# Patient Record
Sex: Male | Born: 1970 | Race: Black or African American | Hispanic: No | Marital: Married | Smoking: Never smoker
Health system: Southern US, Community
[De-identification: ages and names within clinical notes are randomized; demographics above are authoritative.]

## PROBLEM LIST (undated history)

## (undated) DIAGNOSIS — I639 Cerebral infarction, unspecified: Secondary | ICD-10-CM

## (undated) DIAGNOSIS — R011 Cardiac murmur, unspecified: Secondary | ICD-10-CM

## (undated) DIAGNOSIS — F40240 Claustrophobia: Secondary | ICD-10-CM

## (undated) DIAGNOSIS — E785 Hyperlipidemia, unspecified: Secondary | ICD-10-CM

## (undated) DIAGNOSIS — I669 Occlusion and stenosis of unspecified cerebral artery: Secondary | ICD-10-CM

## (undated) DIAGNOSIS — I10 Essential (primary) hypertension: Secondary | ICD-10-CM

## (undated) DIAGNOSIS — D649 Anemia, unspecified: Secondary | ICD-10-CM

## (undated) HISTORY — DX: Hyperlipidemia, unspecified: E78.5

## (undated) HISTORY — DX: Cerebral infarction, unspecified: I63.9

## (undated) HISTORY — DX: Occlusion and stenosis of unspecified cerebral artery: I66.9

## (undated) HISTORY — DX: Essential (primary) hypertension: I10

---

## 2015-01-20 ENCOUNTER — Ambulatory Visit: Payer: Self-pay | Admitting: Family Medicine

## 2015-02-21 ENCOUNTER — Encounter: Payer: Self-pay | Admitting: Family Medicine

## 2015-02-21 ENCOUNTER — Ambulatory Visit (INDEPENDENT_AMBULATORY_CARE_PROVIDER_SITE_OTHER): Payer: BLUE CROSS/BLUE SHIELD | Admitting: Family Medicine

## 2015-02-21 VITALS — BP 140/78 | HR 86 | Temp 98.8°F | Resp 18 | Ht 74.0 in | Wt 214.7 lb

## 2015-02-21 DIAGNOSIS — I10 Essential (primary) hypertension: Secondary | ICD-10-CM

## 2015-02-21 DIAGNOSIS — E782 Mixed hyperlipidemia: Secondary | ICD-10-CM | POA: Insufficient documentation

## 2015-02-21 DIAGNOSIS — E785 Hyperlipidemia, unspecified: Secondary | ICD-10-CM

## 2015-02-21 DIAGNOSIS — R011 Cardiac murmur, unspecified: Secondary | ICD-10-CM | POA: Diagnosis not present

## 2015-02-21 NOTE — Progress Notes (Signed)
Name: Charles Alvarado   MRN: [id number]    DOB: Oct 30, 1970   Date:02/21/2015       Progress Note  Subjective  Chief Complaint  Chief Complaint  Patient presents with  . Establish Care    NP  . Hypertension    Hypertension This is a chronic problem. The problem is unchanged. The problem is controlled. Pertinent negatives include no chest pain, headaches, orthopnea, palpitations or shortness of breath. Past treatments include ACE inhibitors. There are no compliance problems.  There is no history of kidney disease, CAD/MI or CVA.  Hyperlipidemia This is a new problem. This is a new diagnosis. Condition status: 2 weeks ago, blood work at his job which came back showing elevated cholesterol. Pertinent negatives include no chest pain or shortness of breath.    Past Medical History  Diagnosis Date  . Hypertension   . Hyperlipidemia     History reviewed. No pertinent past surgical history.  Family History  Problem Relation Age of Onset  . Hypertension Mother   . Hypertension Father   . Diabetes Father   . Diabetes Sister   . Hypertension Sister     Social History   Social History  . Marital Status: Married    Spouse Name: N/A  . Number of Children: N/A  . Years of Education: N/A   Occupational History  . Not on file.   Social History Main Topics  . Smoking status: Never Smoker   . Smokeless tobacco: Never Used  . Alcohol Use: No  . Drug Use: No  . Sexual Activity:    Partners: Female   Other Topics Concern  . Not on file   Social History Narrative  . No narrative on file     Current outpatient prescriptions:  .  lisinopril (PRINIVIL,ZESTRIL) 20 MG tablet, Take 20 mg by mouth daily., Disp: , Rfl:   No Known Allergies   Review of Systems  Respiratory: Negative for shortness of breath.   Cardiovascular: Negative for chest pain, palpitations and orthopnea.  Neurological: Negative for headaches.     Objective  Filed Vitals:   02/21/15 1504  BP:  140/78  Pulse: 86  Temp: 98.8 F (37.1 C)  TempSrc: Oral  Resp: 18  Height: 6\' 2"  (1.88 m)  Weight: 214 lb 11.2 oz (97.387 kg)  SpO2: 97%    Physical Exam  Constitutional: He is oriented to person, place, and time and well-developed, well-nourished, and in no distress.  HENT:  Head: Normocephalic and atraumatic.  Eyes: Pupils are equal, round, and reactive to light.  Cardiovascular: Normal rate and regular rhythm.   Murmur heard. Pulmonary/Chest: Breath sounds normal.  Abdominal: Soft. Bowel sounds are normal.  Musculoskeletal: He exhibits no edema.  Neurological: He is alert and oriented to person, place, and time.  Skin: Skin is warm and dry.  Psychiatric: Mood, memory, affect and judgment normal.  Nursing note and vitals reviewed.  Assessment & Plan  1. Heart murmur  - Ambulatory referral to Cardiology  2. Essential hypertension We will request prior records for review. Patient to continue on lisinopril for now, advised to check BP at home and will review the lungs.  3. Hyperlipidemia Obtain copy of labwork performed at patient's job. Follow-up.  Hermilo Dutter Asad A. Faylene KurtzShah [facility] 02/21/2015 3:29 PM

## 2015-04-05 ENCOUNTER — Ambulatory Visit: Payer: BLUE CROSS/BLUE SHIELD | Admitting: Family Medicine

## 2015-04-12 ENCOUNTER — Ambulatory Visit: Payer: Self-pay | Admitting: Cardiovascular Disease

## 2015-04-13 ENCOUNTER — Ambulatory Visit (INDEPENDENT_AMBULATORY_CARE_PROVIDER_SITE_OTHER): Payer: BLUE CROSS/BLUE SHIELD | Admitting: Family Medicine

## 2015-04-13 ENCOUNTER — Encounter: Payer: Self-pay | Admitting: Family Medicine

## 2015-04-13 VITALS — BP 136/70 | HR 93 | Temp 98.8°F | Resp 17 | Ht 74.0 in | Wt 221.2 lb

## 2015-04-13 DIAGNOSIS — E785 Hyperlipidemia, unspecified: Secondary | ICD-10-CM

## 2015-04-13 DIAGNOSIS — I10 Essential (primary) hypertension: Secondary | ICD-10-CM | POA: Diagnosis not present

## 2015-04-13 NOTE — Progress Notes (Signed)
Name: Charles Alvarado   MRN: [id number]    DOB: March 12, 1970   Date:04/13/2015       Progress Note  Subjective  Chief Complaint  Chief Complaint  Patient presents with  . Follow-up    6 wk  . Hypertension  . Hyperlipidemia    Hypertension This is a chronic problem. The problem is controlled. Pertinent negatives include no chest pain, headaches, palpitations or shortness of breath. Past treatments include ACE inhibitors. There is no history of kidney disease, CAD/MI or CVA.  Hyperlipidemia Pertinent negatives include no chest pain or shortness of breath.     Past Medical History  Diagnosis Date  . Hypertension   . Hyperlipidemia     History reviewed. No pertinent past surgical history.  Family History  Problem Relation Age of Onset  . Hypertension Mother   . Hypertension Father   . Diabetes Father   . Diabetes Sister   . Hypertension Sister     Social History   Social History  . Marital Status: Married    Spouse Name: N/A  . Number of Children: N/A  . Years of Education: N/A   Occupational History  . Not on file.   Social History Main Topics  . Smoking status: Never Smoker   . Smokeless tobacco: Never Used  . Alcohol Use: No  . Drug Use: No  . Sexual Activity:    Partners: Female   Other Topics Concern  . Not on file   Social History Narrative     Current outpatient prescriptions:  .  lisinopril (PRINIVIL,ZESTRIL) 20 MG tablet, Take 20 mg by mouth daily., Disp: , Rfl:   No Known Allergies   Review of Systems  Respiratory: Negative for shortness of breath.   Cardiovascular: Negative for chest pain and palpitations.  Neurological: Negative for headaches.    Objective  Filed Vitals:   04/13/15 1549  BP: 136/70  Pulse: 93  Temp: 98.8 F (37.1 C)  TempSrc: Oral  Resp: 17  Height: 6\' 2"  (1.88 m)  Weight: 221 lb 3.2 oz (100.336 kg)  SpO2: 99%    Physical Exam  Constitutional: He is oriented to person, place, and time and  well-developed, well-nourished, and in no distress.  Cardiovascular: Normal rate and regular rhythm.   Pulmonary/Chest: Effort normal and breath sounds normal.  Neurological: He is alert and oriented to person, place, and time.  Nursing note and vitals reviewed.     Assessment & Plan  1. Essential hypertension BP stable and at goal.  2. Hyperlipidemia Obtain fasting lipid panel and follow-up. - Lipid Profile - Comprehensive Metabolic Panel (CMET)   Asusena Sigley Asad A. Faylene KurtzShah [facility] 04/13/2015 4:45 PM

## 2015-04-25 ENCOUNTER — Emergency Department: Payer: Worker's Compensation

## 2015-04-25 ENCOUNTER — Emergency Department
Admission: EM | Admit: 2015-04-25 | Discharge: 2015-04-25 | Disposition: A | Discharge status: Home / Self Care | Payer: Worker's Compensation | Attending: Emergency Medicine | Admitting: Emergency Medicine

## 2015-04-25 DIAGNOSIS — Y990 Civilian activity done for income or pay: Secondary | ICD-10-CM | POA: Insufficient documentation

## 2015-04-25 DIAGNOSIS — S0219XB Other fracture of base of skull, initial encounter for open fracture: Secondary | ICD-10-CM | POA: Diagnosis not present

## 2015-04-25 DIAGNOSIS — S0181XA Laceration without foreign body of other part of head, initial encounter: Secondary | ICD-10-CM

## 2015-04-25 DIAGNOSIS — S43004A Unspecified dislocation of right shoulder joint, initial encounter: Secondary | ICD-10-CM | POA: Diagnosis not present

## 2015-04-25 DIAGNOSIS — Y9389 Activity, other specified: Secondary | ICD-10-CM | POA: Diagnosis not present

## 2015-04-25 DIAGNOSIS — W010XXA Fall on same level from slipping, tripping and stumbling without subsequent striking against object, initial encounter: Secondary | ICD-10-CM | POA: Insufficient documentation

## 2015-04-25 DIAGNOSIS — Y929 Unspecified place or not applicable: Secondary | ICD-10-CM | POA: Diagnosis not present

## 2015-04-25 DIAGNOSIS — Z79899 Other long term (current) drug therapy: Secondary | ICD-10-CM | POA: Diagnosis not present

## 2015-04-25 DIAGNOSIS — E785 Hyperlipidemia, unspecified: Secondary | ICD-10-CM | POA: Diagnosis not present

## 2015-04-25 DIAGNOSIS — I10 Essential (primary) hypertension: Secondary | ICD-10-CM | POA: Diagnosis not present

## 2015-04-25 MED ORDER — TETANUS-DIPHTH-ACELL PERTUSSIS 5-2.5-18.5 LF-MCG/0.5 IM SUSP
0.5000 mL | Freq: Once | INTRAMUSCULAR | Status: AC
Start: 2015-04-25 — End: 2015-04-25
  Administered 2015-04-25: 0.5 mL via INTRAMUSCULAR

## 2015-04-25 MED ORDER — PROPOFOL 1000 MG/100ML IV EMUL
INTRAVENOUS | Status: AC
  Filled 2015-04-25: qty 100

## 2015-04-25 MED ORDER — AMOXICILLIN-POT CLAVULANATE 875-125 MG PO TABS: 1.0000 | ORAL_TABLET | Freq: Two times a day (BID) | ORAL | Status: AC

## 2015-04-25 MED ORDER — PROPOFOL 10 MG/ML IV BOLUS
INTRAVENOUS | Status: AC | PRN
  Administered 2015-04-25: 80 mg via INTRAVENOUS

## 2015-04-25 MED ORDER — TETANUS-DIPHTHERIA TOXOIDS TD 5-2 LFU IM INJ: 0.5000 mL | INJECTION | Freq: Once | INTRAMUSCULAR | Status: DC

## 2015-04-25 MED ORDER — LIDOCAINE-EPINEPHRINE (PF) 1 %-1:200000 IJ SOLN
INTRAMUSCULAR | Status: AC
  Administered 2015-04-25: 30 mL
  Filled 2015-04-25: qty 30

## 2015-04-25 MED ORDER — LIDOCAINE-EPINEPHRINE (PF) 2 %-1:200000 IJ SOLN
20.0000 mL | Freq: Once | INTRAMUSCULAR | Status: DC
  Filled 2015-04-25: qty 20

## 2015-04-25 MED ORDER — OXYCODONE-ACETAMINOPHEN 5-325 MG PO TABS
1.0000 | ORAL_TABLET | Freq: Once | ORAL | Status: AC
  Administered 2015-04-25: 1 via ORAL
  Filled 2015-04-25: qty 1

## 2015-04-25 MED ORDER — HYDROCODONE-ACETAMINOPHEN 5-325 MG PO TABS: 1.0000 | ORAL_TABLET | ORAL | Status: DC | PRN

## 2015-04-25 NOTE — ED Notes (Addendum)
Pt consent forms and moderate sedation d.c instructions signed at this time and placed on pt's chart. Pt given copy of d.c instructions at this time, family at bedside, vitals stable, NAD noted. Per MD Kinner, sedation will take place momentarily. Pt and family made aware and verbalized understanding at this time.

## 2015-04-25 NOTE — ED Notes (Signed)
Patient transported to CT 

## 2015-04-25 NOTE — ED Provider Notes (Signed)
Presedation evaluation performed at 1:30 PM, post-sedation evaluation performed at 2:20 PM  Jene Everyobert Latesha Chesney, MD 04/25/15 1659

## 2015-04-25 NOTE — ED Notes (Signed)
UDS done @ 1253pm and walked to lab @ 1257

## 2015-04-25 NOTE — ED Provider Notes (Signed)
[facility] Emergency Department Provider Note  ____________________________________________    I have reviewed the triage vital signs and the nursing notes.   HISTORY  Chief Complaint Laceration    HPI Charles Alvarado is a 45 y.o. male who presents after a fall. Patient reports he tripped and fell and hit his forehead at work and also injured his right shoulder. He suffered a laceration to the forehead and to the chin. He denies chest pain or palpitations. He denies other injuries. No dizziness.     Past Medical History  Diagnosis Date  . Hypertension   . Hyperlipidemia     Patient Active Problem List   Diagnosis Date Noted  . Heart murmur 02/21/2015  . Hypertension 02/21/2015  . Hyperlipidemia 02/21/2015    No past surgical history on file.  Current Outpatient Rx  Name  Route  Sig  Dispense  Refill  . lisinopril (PRINIVIL,ZESTRIL) 20 MG tablet   Oral   Take 20 mg by mouth daily.           Allergies Review of patient's allergies indicates no known allergies.  Family History  Problem Relation Age of Onset  . Hypertension Mother   . Hypertension Father   . Diabetes Father   . Diabetes Sister   . Hypertension Sister     Social History Social History  Substance Use Topics  . Smoking status: Never Smoker   . Smokeless tobacco: Never Used  . Alcohol Use: No    Review of Systems  Constitutional: Negative for Dizziness Eyes: Negative for redness ENT: Negative for Neck pain Cardiovascular: Negative for chest pain Respiratory: Negative for shortness of breath. Gastrointestinal: Negative for abdominal pain  Musculoskeletal: Negative for back pain. Right shoulder pain Skin: Positive for laceration Neurological: Negative for focal weakness, no sensory deficits Psychiatric: Positive for anxiety    ____________________________________________   PHYSICAL EXAM:  VITAL SIGNS: ED Triage Vitals  Enc Vitals Group     BP  04/25/15 1123 173/90 mmHg     Pulse Rate 04/25/15 1123 94     Resp 04/25/15 1123 20     Temp --      Temp src --      SpO2 04/25/15 1123 98 %     Weight 04/25/15 1123 220 lb (99.791 kg)     Height 04/25/15 1123  (1.88 m)     Head Cir --      Peak Flow --      Pain Score 04/25/15 1124 10     Pain Loc --      Pain Edu? --      Excl. in GC? --      Constitutional: Alert and oriented. Well appearing and in no distress.  Eyes: Conjunctivae are normal. No erythema or injection ENT   Head: Normocephalic, right forehead 3.5 cm laceration, 3.5 cm laceration to the right chin which appears to be through and through.   Mouth/Throat: Mucous membranes are moist. Teeth align appropriately Cardiovascular: Normal rate, regular rhythm. Normal and symmetric distal pulses are present in the upper extremities.  Respiratory: Normal respiratory effort without tachypnea nor retractions. Breath sounds are clear and equal bilaterally.  Gastrointestinal: Soft and non-tender in all quadrants. No distention. There is no CVA tenderness. Genitourinary: deferred Musculoskeletal: Right shoulder with anterior fullness. 2+ distal pulses. No apparent sensory deficits. Neurologic:  Normal speech and language. No gross focal neurologic deficits are appreciated. Skin:  Skin is warm, dry and intact. No rash noted.  Psychiatric: Mood and affect are normal. Patient exhibits appropriate insight and judgment.  ____________________________________________    LABS (pertinent positives/negatives)  Labs Reviewed - No data to display  ____________________________________________   EKG  ED ECG REPORT I, Jene EveryKINNER, Darlina Mccaughey, the attending physician, personally viewed and interpreted this ECG.  Date: 04/25/2015 EKG Time: 11:44 AM Rate: 80 Rhythm: normal sinus rhythm QRS Axis: normal Intervals: normal ST/T Wave abnormalities: normal Conduction Disturbances: none Narrative Interpretation: Single  PVC   ____________________________________________    RADIOLOGY  Right anterior shoulder dislocation CT head unremarkable ____________________________________________   PROCEDURES  Procedure(s) performed: yes, multiple  1. Shoulder reduction  Reduction of dislocation Date/Time: 2:05 PM Performed by: Jene EveryKINNER, Dain Laseter Authorized by: Jene EveryKINNER, Clela Hagadorn Consent: Verbal consent obtained. Risks and benefits: risks, benefits and alternatives were discussed Consent given by: patient Required items: required blood products, implants, devices, and special equipment available Time out: Immediately prior to procedure a "time out" was called to verify the correct patient, procedure, equipment, support staff and site/side marked as required.  Patient sedated:conscious sedation/propofol  Vitals: Vital signs were monitored during sedation. Patient tolerance: Patient tolerated the procedure well with no immediate complications. Joint: Shoulder Reduction technique: Traction countertraction     Procedural sedation Performed by: Jene EveryKINNER, Stephie Xu Consent: Verbal consent obtained. Risks and benefits: risks, benefits and alternatives were discussed Required items: required blood products, implants, devices, and special equipment available Patient identity confirmed: arm band and provided demographic data Time out: Immediately prior to procedure a "time out" was called to verify the correct patient, procedure, equipment, support staff and site/side marked as required.  Sedation type: moderate (conscious) sedation NPO time confirmed and considedered  Sedatives: Propofol  Physician Time at Bedside: 15 minutes  Vitals: Vital signs were monitored during sedation. Cardiac Monitor, pulse oximeter Patient tolerance: Patient tolerated the procedure well with no immediate complications. Comments: Pt with uneventful recovered. Returned to pre-procedural sedation baseline      2. Laceration  repair LACERATION REPAIR Performed by: Jene EveryKINNER, Haeli Gerlich Authorized by: Jene EveryKINNER, Zedekiah Hinderman Consent: Verbal consent obtained. Risks and benefits: risks, benefits and alternatives were discussed Consent given by: patient Patient identity confirmed: provided demographic data Prepped and Draped in normal sterile fashion Wound explored  Laceration Location: right forehead  Laceration Length: 3.5cm  No Foreign Bodies seen or palpated  Anesthesia: local infiltration  Local anesthetic: lidocaine 1 % with epinephrine  Anesthetic total: 3 ml  Irrigation method: syringe Amount of cleaning: standard  Skin closure: Layered closure, 2 Vicryl   Number of sutures: 4 Prolene   Technique: Simple interrupted   Patient tolerance: Patient tolerated the procedure well with no immediate complications.      3. Laceration repair  LACERATION REPAIR Performed by: Jene EveryKINNER, Dorris Vangorder Authorized by: Jene EveryKINNER, Peytin Dechert Consent: Verbal consent obtained. Risks and benefits: risks, benefits and alternatives were discussed Consent given by: patient Patient identity confirmed: provided demographic data Prepped and Draped in normal sterile fashion Wound explored  Laceration Location: Chin  Laceration Length: A 3.5cm  No Foreign Bodies seen or palpated  Anesthesia: local infiltration  Local anesthetic: lidocaine 1 % with epinephrine  Anesthetic total: 3 ml  Irrigation method: syringe Amount of cleaning: standard  Skin closure: Prolene   Number of sutures: 4   Technique: Simple interrupted   Patient tolerance: Patient tolerated the procedure well with no immediate complications.   Critical Care performed: none  ____________________________________________   INITIAL IMPRESSION / ASSESSMENT AND PLAN / ED COURSE  Pertinent labs & imaging results that were available during my  care of the patient were reviewed by me and considered in my medical decision making (see chart for  details).  Patient with mechanical fall, anterior right shoulder dislocation, 2 facial lacerations. He will require conscious sedation to reduce the shoulder  Right shoulder successfully reduced with propofol conscious sedation. Lacerations repaired. Tetanus given.  Discussed forehead laceration with ENT surgeon who recommends layered closure and antibiotics and he will see the patient in his office  ____________________________________________   FINAL CLINICAL IMPRESSION(S) / ED DIAGNOSES  Final diagnoses:  Forehead laceration, initial encounter  Chin laceration, initial encounter  Open fracture of frontal sinus, initial encounter [facility])  Shoulder dislocation, right, initial encounter          Jene Every, MD 04/25/15 1656

## 2015-04-25 NOTE — ED Notes (Signed)
MD Kinner at bedside suturing pt's lacerations at this time.

## 2015-04-25 NOTE — ED Notes (Signed)
MD Kinner at bedside  

## 2015-04-25 NOTE — ED Notes (Addendum)
Pharmacy called regarding Boostrix, will send to ED momentarily. Pt will be d/c once med is received and given. Pt and family verbalized understanding.

## 2015-04-25 NOTE — Sedation Documentation (Signed)
Xray at bedside at this time, shoulder reduced successfully.

## 2015-04-25 NOTE — ED Notes (Addendum)
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  Possible loc at time of fall. Puncture wound noted to forehead and laceration noted to chin, no missing teeth noted in triage.  Pt also c/o right shoulder pain. Pt diaphoretic in triage.

## 2015-04-25 NOTE — Discharge Instructions (Signed)
Facial Laceration °A facial laceration is a cut on the face. These injuries can be painful and cause bleeding. Some cuts may need to be closed with stitches (sutures), skin adhesive strips, or wound glue. Cuts usually heal quickly but can leave a scar. It can take 1-2 years for the scar to go away completely. °HOME CARE  °· Only take medicines as told by your doctor. °· Follow your doctor's instructions for wound care. °For Stitches: °· Keep the cut clean and dry. °· If you have a bandage (dressing), change it at least once a day. Change the bandage if it gets wet or dirty, or as told by your doctor. °· Wash the cut with soap and water 2 times a day. Rinse the cut with water. Pat it dry with a clean towel. °· Put a thin layer of medicated cream on the cut as told by your doctor. °· You may shower after the first 24 hours. Do not soak the cut in water until the stitches are removed. °· Have your stitches removed as told by your doctor. °· Do not wear any makeup until a few days after your stitches are removed. °For Skin Adhesive Strips: °· Keep the cut clean and dry. °· Do not get the strips wet. You may take a bath, but be careful to keep the cut dry. °· If the cut gets wet, pat it dry with a clean towel. °· The strips will fall off on their own. Do not remove the strips that are still stuck to the cut. °For Wound Glue: °· You may shower or take baths. Do not soak or scrub the cut. Do not swim. Avoid heavy sweating until the glue falls off on its own. After a shower or bath, pat the cut dry with a clean towel. °· Do not put medicine or makeup on your cut until the glue falls off. °· If you have a bandage, do not put tape over the glue. °· Avoid lots of sunlight or tanning lamps until the glue falls off. °· The glue will fall off on its own in 5-10 days. Do not pick at the glue. °After Healing: °· Put sunscreen on the cut for the first year to reduce your scar. °GET HELP IF: °· You have a fever. °GET HELP RIGHT AWAY  IF:  °· Your cut area gets red, painful, or puffy (swollen). °· You see a yellowish-white fluid (pus) coming from the cut. °  °This information is not intended to replace advice given to you by your health care provider. Make sure you discuss any questions you have with your health care provider. °  °Document Released: 07/11/2007 Document Revised: 02/12/2014 Document Reviewed: 09/04/2012 °Elsevier Interactive Patient Education ©2016 [employer] Inc[employer] ° °Head Injury, Adult °You have a head injury. Headaches and throwing up (vomiting) are common after a head injury. It should be easy to wake up from sleeping. Sometimes you must stay in the hospital. Most problems happen within the first 24 hours. Side effects may occur up to 7-10 days after the injury.  °WHAT ARE THE TYPES OF HEAD INJURIES? °Head injuries can be as minor as a bump. Some head injuries can be more severe. More severe head injuries include: °· A jarring injury to the brain (concussion). °· A bruise of the brain (contusion). This mean there is bleeding in the brain that can cause swelling. °· A cracked skull (skull fracture). °· Bleeding in the brain that collects, clots, and forms a bump (hematoma). °WHEN   SHOULD I GET HELP RIGHT AWAY?   You are confused or sleepy.  You cannot be woken up.  You feel sick to your stomach (nauseous) or keep throwing up (vomiting).  Your dizziness or unsteadiness is getting worse.  You have very bad, lasting headaches that are not helped by medicine. Take medicines only as told by your doctor.  You cannot use your arms or legs like normal.  You cannot walk.  You notice changes in the black spots in the center of the colored part of your eye (pupil).  You have clear or bloody fluid coming from your nose or ears.  You have trouble seeing. During the next 24 hours after the injury, you must stay with someone who can watch you. This person should get help right away (call 911 in the [place] if you start to shake  and are not able to control it (have seizures), you pass out, or you are unable to wake up. HOW CAN I PREVENT A HEAD INJURY IN THE FUTURE?  Wear seat belts.  Wear a helmet while bike riding and playing sports like football.  Stay away from dangerous activities around the house. WHEN CAN I RETURN TO NORMAL ACTIVITIES AND ATHLETICS? See your doctor before doing these activities. You should not do normal activities or play contact sports until 1 week after the following symptoms have stopped:  Headache that does not go away.  Dizziness.  Poor attention.  Confusion.  Memory problems.  Sickness to your stomach or throwing up.  Tiredness.  Fussiness.  Bothered by bright lights or loud noises.  Anxiousness or depression.  Restless sleep. MAKE SURE YOU:   Understand these instructions.  Will watch your condition.  Will get help right away if you are not doing well or get worse.   This information is not intended to replace advice given to you by your health care provider. Make sure you discuss any questions you have with your health care provider.   Document Released: 01/05/2008 Document Revised: 02/12/2014 Document Reviewed: 09/29/2012 Elsevier Interactive Patient Education 2016 [employer].  How to Use a Shoulder Immobilizer A shoulder immobilizer is a device that you may have to wear after a shoulder injury or surgery. This device keeps your arm from moving. This prevents additional pain or injury. It also supports your arm next to your body as your shoulder heals. You may need to wear a shoulder immobilizer to treat a broken bone (fracture) in your shoulder. You may also need to wear one if you have an injury that moves your shoulder out of position (dislocation). There are different types of shoulder immobilizers. The one that you get depends on your injury. RISKS AND COMPLICATIONS Wearing a shoulder immobilizer in the wrong way can let your injured shoulder move  around too much. This may delay healing and make your pain and swelling worse. HOW TO USE YOUR SHOULDER IMMOBILIZER  The part of the immobilizer that goes around your neck (sling) should support your upper arm, with your elbow bent and your lower arm and hand across your chest.  Make sure that your elbow:  Is snug against the back pocket of the sling.  Does not move away from your body.  The strap of the immobilizer should go over your shoulder and support your arm and hand. Your hand should be slightly higher than your elbow. It should not hang loosely over the edge of the sling.  If the long strap has a pad, place it  where it is most comfortable on your neck.  Carefully follow your health care provider's instructions for wearing your shoulder immobilizer. Your health care provider may want you to:  Loosen your immobilizer to straighten your elbow and move your wrist and fingers. You may have to do this several times each day. Ask your health care provider when you should do this and how often.  Remove your immobilizer once every day to shower, but limit the movement in your injured arm. Before putting the immobilizer back on, use a towel to dry the area under your arm completely.  Remove your immobilizer to do shoulder exercises at home as directed by your health care provider.  Wear your immobilizer while you sleep. You may sleep more comfortably if you have your upper body raised on pillows. SEEK MEDICAL CARE IF:  Your immobilizer is not supporting your arm properly.  Your immobilizer gets damaged.  You have worsening pain or swelling in your shoulder, arm, or hand.  Your shoulder, arm, or hand changes color or temperature.  You lose feeling in your shoulder, arm, or hand.   This information is not intended to replace advice given to you by your health care provider. Make sure you discuss any questions you have with your health care provider.   Document Released: 03/01/2004  Document Revised: 06/08/2014 Document Reviewed: 12/30/2013 Elsevier Interactive Patient Education 2016 Elsevier Inc.  Shoulder Dislocation A shoulder dislocation happens when the upper arm bone (humerus) moves out of the shoulder joint. The shoulder joint is the part of the shoulder where the humerus, shoulder blade (scapula), and collarbone (clavicle) meet. CAUSES This condition is often caused by:  A fall.  A hit to the shoulder.  A forceful movement of the shoulder. RISK FACTORS This condition is more likely to develop in people who play sports. SYMPTOMS Symptoms of this condition include:  Deformity of the shoulder.  Intense pain.  Inability to move the shoulder.  Numbness, weakness, or tingling in your neck or down your arm.  Bruising or swelling around your shoulder. DIAGNOSIS This condition is diagnosed with a physical exam. After the exam, tests may be done to check for related problems. Tests that may be done include:  X-ray. This may be done to check for broken bones.  MRI. This may be done to check for damage to the tissues around the shoulder.  Electromyogram. This may be done to check for nerve damage. TREATMENT This condition is treated with a procedure to place the humerus back in the joint. This procedure is called a reduction. There are two types of reduction:  Closed reduction. In this procedure, the humerus is placed back in the joint without surgery. The health care provider uses his or her hands to guide the bone back into place.  Open reduction. In this procedure, the humerus is placed back in the joint with surgery. An open reduction may be recommended if:  You have a weak shoulder joint or weak ligaments.  You have had more than one shoulder dislocation.  The nerves or blood vessels around your shoulder have been damaged. After the humerus is placed back into the joint, your arm will be placed in a splint or sling to prevent it from moving. You  will need to wear the splint or sling until your shoulder heals. When the splint or sling is removed, you may have physical therapy to help improve the range of motion in your shoulder joint. HOME CARE INSTRUCTIONS If You Have a  Splint or Sling:  Wear it as told by your health care provider. Remove it only as told by your health care provider.  Loosen it if your fingers become numb and tingle, or if they turn cold and blue.  Keep it clean and dry. Bathing  Do not take baths, swim, or use a hot tub until your health care provider approves. Ask your health care provider if you can take showers. You may only be allowed to take sponge baths for bathing.  If your health care provider approves bathing and showering, cover your splint or sling with a watertight plastic bag to protect it from water. Do not let the splint or sling get wet. Managing Pain, Stiffness, and Swelling  If directed, apply ice to the injured area.  Put ice in a plastic bag.  Place a towel between your skin and the bag.  Leave the ice on for 20 minutes, 2-3 times per day.  Move your fingers often to avoid stiffness and to decrease swelling.  Raise (elevate) the injured area above the level of your heart while you are sitting or lying down. Driving  Do not drive while wearing a splint or sling on a hand that you use for driving.  Do not drive or operate heavy machinery while taking pain medicine. Activity  Return to your normal activities as told by your health care provider. Ask your health care provider what activities are safe for you.  Perform range-of-motion exercises only as told by your health care provider.  Exercise your hand by squeezing a soft ball. This helps to decrease stiffness and swelling in your hand and wrist. General Instructions  Take over-the-counter and prescription medicines only as told by your health care provider.  Do not use any tobacco products, including cigarettes, chewing  tobacco, or e-cigarettes. Tobacco can delay bone and tissue healing. If you need help quitting, ask your health care provider.  Keep all follow-up visits as told by your health care provider. This is important. SEEK MEDICAL CARE IF:  Your splint or sling gets damaged. SEEK IMMEDIATE MEDICAL CARE IF:  Your pain gets worse rather than better.  You lose feeling in your arm or hand.  Your arm or hand becomes white and cold.   This information is not intended to replace advice given to you by your health care provider. Make sure you discuss any questions you have with your health care provider.   Document Released: 10/17/2000 Document Revised: 10/13/2014 Document Reviewed: 05/17/2014 Elsevier Interactive Patient Education [employer]2016 Elsevier Inc.

## 2015-05-24 ENCOUNTER — Ambulatory Visit: Payer: Self-pay | Admitting: Cardiovascular Disease

## 2015-05-30 ENCOUNTER — Other Ambulatory Visit: Payer: Self-pay | Admitting: Orthopedic Surgery

## 2015-06-02 ENCOUNTER — Ambulatory Visit: Payer: Self-pay | Admitting: Cardiovascular Disease

## 2015-06-06 HISTORY — PX: CLOSED REDUCTION SHOULDER DISLOCATION: SUR242

## 2015-06-14 ENCOUNTER — Other Ambulatory Visit: Payer: Self-pay

## 2015-06-14 ENCOUNTER — Encounter: Payer: Self-pay | Admitting: *Deleted

## 2015-06-14 NOTE — Patient Instructions (Signed)
  Your procedure is scheduled on: 06-16-15 (THURSDAY) Report to [facility] 2ND FLOOR To find out your arrival time please call [phone] between 1PM - 3PM on 06-15-15 [facility](WEDNESDAY)  Remember: Instructions that are not followed completely may result in serious medical risk, up to and including death, or upon the discretion of your surgeon and anesthesiologist your surgery may need to be rescheduled.    _X___ 1. Do not eat food or drink liquids after midnight. No gum chewing or hard candies.     _X___ 2. No Alcohol for 24 hours before or after surgery.   ____ 3. Bring all medications with you on the day of surgery if instructed.    _X___ 4. Notify your doctor if there is any change in your medical condition     (cold, fever, infections).     Do not wear jewelry, make-up, hairpins, clips or nail polish.  Do not wear lotions, powders, or perfumes. You may wear deodorant.  Do not shave 48 hours prior to surgery. Men may shave face and neck.  Do not bring valuables to the hospital.    [facility] belongings or valuables.               Contacts, dentures or bridgework may not be worn into surgery.  Leave your suitcase in the car. After surgery it may be brought to your room.  For patients admitted to the hospital, discharge time is determined by your treatment team.   Patients discharged the day of surgery will not be allowed to drive home.   Please read over the following fact sheets that you were given:     _X___ Take these medicines the morning of surgery with A SIP OF WATER:    1. LISINOPRIL  2.   3.   4.  5.  6.  ____ Fleet Enema (as directed)   _X___ Use CHG Soap as directed  ____ Use inhalers on the day of surgery  ____ Stop metformin 2 days prior to surgery    ____ Take 1/2 of usual insulin dose the night before surgery and none on the morning of surgery.   ____ Stop Coumadin/Plavix/aspirin-N/A  _X___ Stop  Anti-inflammatories-STOP IBUPROFEN NOW-NO NSAIDS OR ASPIRIN PRODUCTS-TYLENOL OK TO TAKE   ____ Stop supplements until after surgery.    ____ Bring C-Pap to the hospital.

## 2015-06-15 ENCOUNTER — Encounter
Admission: RE | Admit: 2015-06-15 | Discharge: 2015-06-15 | Disposition: A | Discharge status: Home / Self Care | Payer: Worker's Compensation | Source: Ambulatory Visit | Attending: Orthopedic Surgery | Admitting: Orthopedic Surgery

## 2015-06-15 DIAGNOSIS — Z833 Family history of diabetes mellitus: Secondary | ICD-10-CM | POA: Diagnosis not present

## 2015-06-15 DIAGNOSIS — Z79899 Other long term (current) drug therapy: Secondary | ICD-10-CM | POA: Diagnosis not present

## 2015-06-15 DIAGNOSIS — I10 Essential (primary) hypertension: Secondary | ICD-10-CM | POA: Diagnosis not present

## 2015-06-15 DIAGNOSIS — M24411 Recurrent dislocation, right shoulder: Secondary | ICD-10-CM | POA: Diagnosis present

## 2015-06-15 DIAGNOSIS — D649 Anemia, unspecified: Secondary | ICD-10-CM | POA: Diagnosis not present

## 2015-06-15 LAB — CBC WITH DIFFERENTIAL/PLATELET
Basophils Absolute: 0.1 10*3/uL (ref 0–0.1)
Basophils Relative: 2 %
EOS ABS: 0.3 10*3/uL (ref 0–0.7)
EOS PCT: 6 %
HCT: 41 % (ref 40.0–52.0)
Hemoglobin: 14 g/dL (ref 13.0–18.0)
LYMPHS ABS: 1.7 10*3/uL (ref 1.0–3.6)
LYMPHS PCT: 37 %
MCH: 27 pg (ref 26.0–34.0)
MCHC: 34.2 g/dL (ref 32.0–36.0)
MCV: 79.1 fL — ABNORMAL LOW (ref 80.0–100.0)
MONO ABS: 0.3 10*3/uL (ref 0.2–1.0)
MONOS PCT: 7 %
Neutro Abs: 2.2 10*3/uL (ref 1.4–6.5)
Neutrophils Relative %: 48 %
PLATELETS: 213 10*3/uL (ref 150–440)
RBC: 5.18 MIL/uL (ref 4.40–5.90)
RDW: 13.2 % (ref 11.5–14.5)
WBC: 4.5 10*3/uL (ref 3.8–10.6)

## 2015-06-15 LAB — BASIC METABOLIC PANEL
Anion gap: 4 — ABNORMAL LOW (ref 5–15)
BUN: 13 mg/dL (ref 6–20)
CHLORIDE: 106 mmol/L (ref 101–111)
CO2: 29 mmol/L (ref 22–32)
CREATININE: 1.1 mg/dL (ref 0.61–1.24)
Calcium: 10.3 mg/dL (ref 8.9–10.3)
GFR calc Af Amer: >60 mL/min (ref >60)
GFR calc non Af Amer: >60 mL/min (ref >60)
GLUCOSE: 98 mg/dL (ref 65–99)
Potassium: 4 mmol/L (ref 3.5–5.1)
SODIUM: 139 mmol/L (ref 135–145)

## 2015-06-15 LAB — PROTIME-INR
INR: 0.95
PROTHROMBIN TIME: 12.9 s (ref 11.4–15.0)

## 2015-06-15 LAB — APTT: aPTT: 29 seconds (ref 24–36)

## 2015-06-15 NOTE — Pre-Procedure Instructions (Signed)
Progress Notes Info    Chartered loss adjusterAuthor Note Status Last Update User Last Update Date/Time   Ellyn HackSyed Asad A Shah, MD Signed Ellyn HackSyed Asad A Shah, MD 02/22/2015 9:34 PM    Progress Notes    Expand All Collapse All   Name: Charles SoChristopher M Ayala MRN: [id number] DOB: [birth year] Date:02/21/2015  Progress Note  Subjective  Chief Complaint  Chief Complaint  Patient presents with  . Establish Care    NP  . Hypertension    Hypertension This is a chronic problem. The problem is unchanged. The problem is controlled. Pertinent negatives include no chest pain, headaches, orthopnea, palpitations or shortness of breath. Past treatments include ACE inhibitors. There are no compliance problems. There is no history of kidney disease, CAD/MI or CVA.  Hyperlipidemia This is a new problem. This is a new diagnosis. Condition status: 2 weeks ago, blood work at his job which came back showing elevated cholesterol. Pertinent negatives include no chest pain or shortness of breath.    Past Medical History  Diagnosis Date  . Hypertension   . Hyperlipidemia     History reviewed. No pertinent past surgical history.  Family History  Problem Relation Age of Onset  . Hypertension Mother   . Hypertension Father   . Diabetes Father   . Diabetes Sister   . Hypertension Sister     Social History   Social History  . Marital Status: Married    Spouse Name: N/A  . Number of Children: N/A  . Years of Education: N/A   Occupational History  . Not on file.   Social History Main Topics  . Smoking status: Never Smoker   . Smokeless tobacco: Never Used  . Alcohol Use: No  . Drug Use: No  . Sexual Activity:    Partners: Female   Other Topics Concern  . Not on file   Social History Narrative  . No narrative on file     Current outpatient  prescriptions:  . lisinopril (PRINIVIL,ZESTRIL) 20 MG tablet, Take 20 mg by mouth daily., Disp: , Rfl:   No Known Allergies   Review of Systems  Respiratory: Negative for shortness of breath.  Cardiovascular: Negative for chest pain, palpitations and orthopnea.  Neurological: Negative for headaches.     Objective  Filed Vitals:   02/21/15 1504  BP: 140/78  Pulse: 86  Temp: 98.8 F (37.1 C)  TempSrc: Oral  Resp: 18  Height: 6\' 2"  (1.88 m)  Weight: 214 lb 11.2 oz (97.387 kg)  SpO2: 97%    Physical Exam  Constitutional: He is oriented to person, place, and time and well-developed, well-nourished, and in no distress.  HENT:  Head: Normocephalic and atraumatic.  Eyes: Pupils are equal, round, and reactive to light.  Cardiovascular: Normal rate and regular rhythm.  Murmur heard. Pulmonary/Chest: Breath sounds normal.  Abdominal: Soft. Bowel sounds are normal.  Musculoskeletal: He exhibits no edema.  Neurological: He is alert and oriented to person, place, and time.  Skin: Skin is warm and dry.  Psychiatric: Mood, memory, affect and judgment normal.  Nursing note and vitals reviewed.  Assessment & Plan  1. Heart murmur  - Ambulatory referral to Cardiology  2. Essential hypertension We will request prior records for review. Patient to continue on lisinopril for now, advised to check BP at home and will review the lungs.  3. Hyperlipidemia Obtain copy of labwork performed at patient's job. Follow-up.  Syed Asad A. Faylene KurtzShah [facility] 02/21/2015 3:29 PM

## 2015-06-15 NOTE — Pre-Procedure Instructions (Signed)
Patient Name Sex DOB SSN   Charles Alvarado, Charles Alvarado Male 27-Oct-1970 [id number]    ED Provider Notes by Jene Every, MD at 04/25/2015 12:24 PM    Author: Jene Every, MD Service: (none) Author Type: Physician   Filed: 04/25/2015 4:56 PM Note Time: 04/25/2015 12:24 PM Status: Signed   Editor: Jene Every, MD (Physician)     Expand All Collapse All   [facility] Emergency Department Provider Note  ____________________________________________    I have reviewed the triage vital signs and the nursing notes.   HISTORY  Chief Complaint Laceration    HPI Charles Alvarado is a 45 y.o. male who presents after a fall. Patient reports he tripped and fell and hit his forehead at work and also injured his right shoulder. He suffered a laceration to the forehead and to the chin. He denies chest pain or palpitations. He denies other injuries. No dizziness.     Past Medical History  Diagnosis Date  . Hypertension   . Hyperlipidemia     Patient Active Problem List   Diagnosis Date Noted  . Heart murmur 02/21/2015  . Hypertension 02/21/2015  . Hyperlipidemia 02/21/2015    No past surgical history on file.  Current Outpatient Rx  Name  Route  Sig  Dispense  Refill  . lisinopril (PRINIVIL,ZESTRIL) 20 MG tablet   Oral   Take 20 mg by mouth daily.           Allergies Review of patient's allergies indicates no known allergies.  Family History  Problem Relation Age of Onset  . Hypertension Mother   . Hypertension Father   . Diabetes Father   . Diabetes Sister   . Hypertension Sister     Social History Social History  Substance Use Topics  . Smoking status: Never Smoker   . Smokeless tobacco: Never Used  . Alcohol Use: No    Review of Systems  Constitutional: Negative for Dizziness Eyes: Negative for redness ENT: Negative for Neck  pain Cardiovascular: Negative for chest pain Respiratory: Negative for shortness of breath. Gastrointestinal: Negative for abdominal pain  Musculoskeletal: Negative for back pain. Right shoulder pain Skin: Positive for laceration Neurological: Negative for focal weakness, no sensory deficits Psychiatric: Positive for anxiety    ____________________________________________   PHYSICAL EXAM:  VITAL SIGNS: ED Triage Vitals  Enc Vitals Group   BP 04/25/15 1123 173/90 mmHg   Pulse Rate 04/25/15 1123 94   Resp 04/25/15 1123 20   Temp --    Temp src --    SpO2 04/25/15 1123 98 %   Weight 04/25/15 1123 220 lb (99.791 kg)   Height 04/25/15 1123  (1.88 m)   Head Cir --    Peak Flow --    Pain Score 04/25/15 1124 10   Pain Loc --    Pain Edu? --    Excl. in GC? --      Constitutional: Alert and oriented. Well appearing and in no distress.  Eyes: Conjunctivae are normal. No erythema or injection ENT   Head: Normocephalic, right forehead 3.5 cm laceration, 3.5 cm laceration to the right chin which appears to be through and through.   Mouth/Throat: Mucous membranes are moist. Teeth align appropriately Cardiovascular: Normal rate, regular rhythm. Normal and symmetric distal pulses are present in the upper extremities.  Respiratory: Normal respiratory effort without tachypnea nor retractions. Breath sounds are clear and equal bilaterally.  Gastrointestinal: Soft and non-tender in all quadrants. No distention. There is no  CVA tenderness. Genitourinary: deferred Musculoskeletal: Right shoulder with anterior fullness. 2+ distal pulses. No apparent sensory deficits. Neurologic: Normal speech and language. No gross focal neurologic deficits are appreciated. Skin: Skin is warm, dry and intact. No rash noted. Psychiatric: Mood and affect are normal. Patient exhibits appropriate insight and  judgment.  ____________________________________________   LABS (pertinent positives/negatives)  Labs Reviewed - No data to display  ____________________________________________   EKG  ED ECG REPORT I, Jene EveryKINNER, ROBERT, the attending physician, personally viewed and interpreted this ECG.  Date: 04/25/2015 EKG Time: 11:44 AM Rate: 80 Rhythm: normal sinus rhythm QRS Axis: normal Intervals: normal ST/T Wave abnormalities: normal Conduction Disturbances: none Narrative Interpretation: Single PVC   ____________________________________________   RADIOLOGY  Right anterior shoulder dislocation CT head unremarkable ____________________________________________   PROCEDURES  Procedure(s) performed: yes, multiple  1. Shoulder reduction  Reduction of dislocation Date/Time: 2:05 PM Performed by: Jene EveryKINNER, ROBERT Authorized by: Jene EveryKINNER, ROBERT Consent: Verbal consent obtained. Risks and benefits: risks, benefits and alternatives were discussed Consent given by: patient Required items: required blood products, implants, devices, and special equipment available Time out: Immediately prior to procedure a "time out" was called to verify the correct patient, procedure, equipment, support staff and site/side marked as required.  Patient sedated:conscious sedation/propofol  Vitals: Vital signs were monitored during sedation. Patient tolerance: Patient tolerated the procedure well with no immediate complications. Joint: Shoulder Reduction technique: Traction countertraction    Procedural sedation Performed by: Jene EveryKINNER, ROBERT Consent: Verbal consent obtained. Risks and benefits: risks, benefits and alternatives were discussed Required items: required blood products, implants, devices, and special equipment available Patient identity confirmed: arm band and provided demographic data Time out: Immediately prior to procedure a "time out" was called to verify the correct patient,  procedure, equipment, support staff and site/side marked as required.  Sedation type: moderate (conscious) sedation NPO time confirmed and considedered  Sedatives: Propofol  Physician Time at Bedside: 15 minutes  Vitals: Vital signs were monitored during sedation. Cardiac Monitor, pulse oximeter Patient tolerance: Patient tolerated the procedure well with no immediate complications. Comments: Pt with uneventful recovered. Returned to pre-procedural sedation baseline      2. Laceration repair LACERATION REPAIR Performed by: Jene EveryKINNER, ROBERT Authorized by: Jene EveryKINNER, ROBERT Consent: Verbal consent obtained. Risks and benefits: risks, benefits and alternatives were discussed Consent given by: patient Patient identity confirmed: provided demographic data Prepped and Draped in normal sterile fashion Wound explored  Laceration Location: right forehead  Laceration Length: 3.5cm  No Foreign Bodies seen or palpated  Anesthesia: local infiltration  Local anesthetic: lidocaine 1 % with epinephrine  Anesthetic total: 3 ml  Irrigation method: syringe Amount of cleaning: standard  Skin closure: Layered closure, 2 Vicryl   Number of sutures: 4 Prolene   Technique: Simple interrupted   Patient tolerance: Patient tolerated the procedure well with no immediate complications.      3. Laceration repair  LACERATION REPAIR Performed by: Jene EveryKINNER, ROBERT Authorized by: Jene EveryKINNER, ROBERT Consent: Verbal consent obtained. Risks and benefits: risks, benefits and alternatives were discussed Consent given by: patient Patient identity confirmed: provided demographic data Prepped and Draped in normal sterile fashion Wound explored  Laceration Location: Chin  Laceration Length: A 3.5cm  No Foreign Bodies seen or palpated  Anesthesia: local infiltration  Local anesthetic: lidocaine 1 % with epinephrine  Anesthetic total: 3 ml  Irrigation method: syringe Amount of cleaning:  standard  Skin closure: Prolene   Number of sutures: 4   Technique: Simple interrupted   Patient tolerance: Patient tolerated  the procedure well with no immediate complications.   Critical Care performed: none  ____________________________________________   INITIAL IMPRESSION / ASSESSMENT AND PLAN / ED COURSE  Pertinent labs & imaging results that were available during my care of the patient were reviewed by me and considered in my medical decision making (see chart for details).  Patient with mechanical fall, anterior right shoulder dislocation, 2 facial lacerations. He will require conscious sedation to reduce the shoulder  Right shoulder successfully reduced with propofol conscious sedation. Lacerations repaired. Tetanus given.  Discussed forehead laceration with ENT surgeon who recommends layered closure and antibiotics and he will see the patient in his office  ____________________________________________   FINAL CLINICAL IMPRESSION(S) / ED DIAGNOSES  Final diagnoses:  Forehead laceration, initial encounter  Chin laceration, initial encounter  Open fracture of frontal sinus, initial encounter [facility])  Shoulder dislocation, right, initial encounter          Jene Every, MD 04/25/15 1656

## 2015-06-15 NOTE — Pre-Procedure Instructions (Signed)
CALLED DR Karlton LemonKARENZ REGARDING NEW HEART MURMUR PER PCP-[facility] WANTED HIM TO BE CHECKED OUT BY CARDIOLOGY- PT NEVER WENT TO CARDIOLOGIST AND NOW HAS SHOULDER SURGERY IN AM-DR Karlton LemonKARENZ SPOKE WITH DR VAN STAVERN AND THEY SAID PT OK TO PROCEED WITH SURGERY

## 2015-06-16 ENCOUNTER — Ambulatory Visit: Payer: Worker's Compensation | Admitting: Anesthesiology

## 2015-06-16 ENCOUNTER — Encounter
Admission: RE | Disposition: A | Discharge status: Home / Self Care | Payer: Self-pay | Source: Ambulatory Visit | Attending: Orthopedic Surgery

## 2015-06-16 ENCOUNTER — Ambulatory Visit
Admission: RE | Admit: 2015-06-16 | Discharge: 2015-06-16 | Disposition: A | Discharge status: Home / Self Care | Payer: Worker's Compensation | Source: Ambulatory Visit | Attending: Orthopedic Surgery | Admitting: Orthopedic Surgery

## 2015-06-16 ENCOUNTER — Encounter: Payer: Self-pay | Admitting: Emergency Medicine

## 2015-06-16 DIAGNOSIS — Z833 Family history of diabetes mellitus: Secondary | ICD-10-CM | POA: Insufficient documentation

## 2015-06-16 DIAGNOSIS — D649 Anemia, unspecified: Secondary | ICD-10-CM | POA: Insufficient documentation

## 2015-06-16 DIAGNOSIS — M24411 Recurrent dislocation, right shoulder: Secondary | ICD-10-CM | POA: Diagnosis not present

## 2015-06-16 DIAGNOSIS — I10 Essential (primary) hypertension: Secondary | ICD-10-CM | POA: Insufficient documentation

## 2015-06-16 DIAGNOSIS — Z79899 Other long term (current) drug therapy: Secondary | ICD-10-CM | POA: Insufficient documentation

## 2015-06-16 HISTORY — DX: Anemia, unspecified: D64.9

## 2015-06-16 HISTORY — DX: Cardiac murmur, unspecified: R01.1

## 2015-06-16 HISTORY — PX: SHOULDER ARTHROSCOPY WITH OPEN ROTATOR CUFF REPAIR AND DISTAL CLAVICLE ACROMINECTOMY: SHX5683

## 2015-06-16 HISTORY — DX: Claustrophobia: F40.240

## 2015-06-16 SURGERY — SHOULDER ARTHROSCOPY WITH OPEN ROTATOR CUFF REPAIR AND DISTAL CLAVICLE ACROMINECTOMY
Anesthesia: General | Laterality: Right

## 2015-06-16 MED ORDER — OXYCODONE HCL 5 MG PO TABS: 5.0000 mg | ORAL_TABLET | ORAL | Status: DC | PRN

## 2015-06-16 MED ORDER — ONDANSETRON HCL 4 MG/2ML IJ SOLN: 4.0000 mg | Freq: Once | INTRAMUSCULAR | Status: DC | PRN

## 2015-06-16 MED ORDER — ONDANSETRON HCL 4 MG/2ML IJ SOLN
INTRAMUSCULAR | Status: DC | PRN
  Administered 2015-06-16: 4 mg via INTRAVENOUS

## 2015-06-16 MED ORDER — FENTANYL CITRATE (PF) 100 MCG/2ML IJ SOLN
INTRAMUSCULAR | Status: DC | PRN
  Administered 2015-06-16: 50 ug via INTRAVENOUS
  Administered 2015-06-16: 100 ug via INTRAVENOUS
  Administered 2015-06-16: 25 ug via INTRAVENOUS
  Administered 2015-06-16 (×2): 50 ug via INTRAVENOUS

## 2015-06-16 MED ORDER — LACTATED RINGERS IV SOLN
INTRAVENOUS | Status: DC
  Administered 2015-06-16: 06:00:00 via INTRAVENOUS

## 2015-06-16 MED ORDER — GLYCOPYRROLATE 0.2 MG/ML IJ SOLN
INTRAMUSCULAR | Status: DC | PRN
  Administered 2015-06-16: 0.4 mg via INTRAVENOUS

## 2015-06-16 MED ORDER — BUPIVACAINE HCL (PF) 0.25 % IJ SOLN
INTRAMUSCULAR | Status: AC
  Filled 2015-06-16: qty 30

## 2015-06-16 MED ORDER — CEFAZOLIN SODIUM-DEXTROSE 2-4 GM/100ML-% IV SOLN
INTRAVENOUS | Status: AC
  Filled 2015-06-16: qty 100

## 2015-06-16 MED ORDER — FAMOTIDINE 20 MG PO TABS
20.0000 mg | ORAL_TABLET | Freq: Once | ORAL | Status: AC
  Administered 2015-06-16: 20 mg via ORAL

## 2015-06-16 MED ORDER — KETAMINE HCL 10 MG/ML IJ SOLN
INTRAMUSCULAR | Status: DC | PRN
  Administered 2015-06-16: 50 mg via INTRAVENOUS

## 2015-06-16 MED ORDER — CHLORHEXIDINE GLUCONATE 4 % EX LIQD: 1.0000 "application " | Freq: Once | CUTANEOUS | Status: DC

## 2015-06-16 MED ORDER — BUPIVACAINE HCL 0.25 % IJ SOLN
INTRAMUSCULAR | Status: DC | PRN
  Administered 2015-06-16: 30 mL

## 2015-06-16 MED ORDER — FENTANYL CITRATE (PF) 100 MCG/2ML IJ SOLN
25.0000 ug | INTRAMUSCULAR | Status: DC | PRN
  Administered 2015-06-16 (×4): 25 ug via INTRAVENOUS

## 2015-06-16 MED ORDER — ONDANSETRON HCL 4 MG PO TABS: 4.0000 mg | ORAL_TABLET | Freq: Three times a day (TID) | ORAL | Status: DC | PRN

## 2015-06-16 MED ORDER — NEOSTIGMINE METHYLSULFATE 10 MG/10ML IV SOLN
INTRAVENOUS | Status: DC | PRN
  Administered 2015-06-16: 2 mg via INTRAVENOUS

## 2015-06-16 MED ORDER — ROCURONIUM BROMIDE 100 MG/10ML IV SOLN
INTRAVENOUS | Status: DC | PRN
  Administered 2015-06-16: 50 mg via INTRAVENOUS

## 2015-06-16 MED ORDER — LIDOCAINE HCL (PF) 1 % IJ SOLN
INTRAMUSCULAR | Status: DC | PRN
  Administered 2015-06-16: 10 mL

## 2015-06-16 MED ORDER — MIDAZOLAM HCL 2 MG/2ML IJ SOLN
INTRAMUSCULAR | Status: DC | PRN
  Administered 2015-06-16 (×2): 2 mg via INTRAVENOUS

## 2015-06-16 MED ORDER — LIDOCAINE HCL (PF) 1 % IJ SOLN
INTRAMUSCULAR | Status: AC
  Filled 2015-06-16: qty 30

## 2015-06-16 MED ORDER — NEOMYCIN-POLYMYXIN B GU 40-200000 IR SOLN
Status: AC
  Filled 2015-06-16: qty 2

## 2015-06-16 MED ORDER — FAMOTIDINE 20 MG PO TABS
ORAL_TABLET | ORAL | Status: AC
  Administered 2015-06-16: 20 mg via ORAL
  Filled 2015-06-16: qty 1

## 2015-06-16 MED ORDER — CEFAZOLIN SODIUM-DEXTROSE 2-4 GM/100ML-% IV SOLN
2.0000 g | INTRAVENOUS | Status: AC
  Administered 2015-06-16: 2 g via INTRAVENOUS

## 2015-06-16 MED ORDER — PROPOFOL 10 MG/ML IV BOLUS
INTRAVENOUS | Status: DC | PRN
  Administered 2015-06-16: 200 mg via INTRAVENOUS

## 2015-06-16 MED ORDER — FENTANYL CITRATE (PF) 100 MCG/2ML IJ SOLN
INTRAMUSCULAR | Status: AC
  Administered 2015-06-16: 25 ug via INTRAVENOUS
  Filled 2015-06-16: qty 2

## 2015-06-16 MED ORDER — LIDOCAINE HCL (CARDIAC) 20 MG/ML IV SOLN
INTRAVENOUS | Status: DC | PRN
  Administered 2015-06-16: 80 mg via INTRAVENOUS

## 2015-06-16 MED ORDER — ROPIVACAINE HCL 5 MG/ML IJ SOLN
INTRAMUSCULAR | Status: AC
  Filled 2015-06-16: qty 20

## 2015-06-16 MED ORDER — METOCLOPRAMIDE HCL 5 MG/ML IJ SOLN
INTRAMUSCULAR | Status: DC | PRN
  Administered 2015-06-16: 10 mg via INTRAVENOUS

## 2015-06-16 MED ORDER — EPINEPHRINE HCL 1 MG/ML IJ SOLN
INTRAMUSCULAR | Status: AC
  Filled 2015-06-16: qty 1

## 2015-06-16 MED ORDER — EPHEDRINE SULFATE 50 MG/ML IJ SOLN
INTRAMUSCULAR | Status: DC | PRN
  Administered 2015-06-16 (×3): 5 mg via INTRAVENOUS
  Administered 2015-06-16: 10 mg via INTRAVENOUS
  Administered 2015-06-16: 5 mg via INTRAVENOUS

## 2015-06-16 MED ORDER — DEXAMETHASONE SODIUM PHOSPHATE 10 MG/ML IJ SOLN
INTRAMUSCULAR | Status: DC | PRN
  Administered 2015-06-16: 4 mg via INTRAVENOUS

## 2015-06-16 MED ORDER — PHENYLEPHRINE HCL 10 MG/ML IJ SOLN
INTRAMUSCULAR | Status: DC | PRN
  Administered 2015-06-16: 200 ug via INTRAVENOUS

## 2015-06-16 MED ORDER — EPINEPHRINE HCL 1 MG/ML IJ SOLN
INTRAMUSCULAR | Status: DC | PRN
  Administered 2015-06-16: 16 mL

## 2015-06-16 SURGICAL SUPPLY — 76 items
ADAPTER IRRIG TUBE 2 SPIKE SOL (ADAPTER) ×6 IMPLANT
ANCHOR STRTK 3X14.5 BIOC TWO#2 (Anchor) ×3 IMPLANT
ANCHOR SUT PEEK 3.0 ST 3 (SUTURE) IMPLANT
ANCHOR SUT PEEK 3.0MM ST 3MM (SUTURE)
ANCHOR SUTURETAK 3X14.5 BIOC (Anchor) ×9 IMPLANT
BUR RADIUS 4.0X18.5 (BURR) ×3 IMPLANT
BUR RADIUS 5.5 (BURR) ×3 IMPLANT
CANNULA 5.75X7 CRYSTAL CLEAR (CANNULA) ×3 IMPLANT
CANNULA PARTIAL THREAD 2X7 (CANNULA) ×3 IMPLANT
CANNULA TWIST IN 8.25X9CM (CANNULA) IMPLANT
CLOSURE WOUND 1/2 X4 (GAUZE/BANDAGES/DRESSINGS) ×1
CONNECTOR PERFECT PASSER (CONNECTOR) IMPLANT
COOLER POLAR GLACIER W/PUMP (MISCELLANEOUS) IMPLANT
CRADLE LAMINECT ARM (MISCELLANEOUS) ×6 IMPLANT
DRAPE IMP U-DRAPE 54X76 (DRAPES) ×6 IMPLANT
DRAPE INCISE IOBAN 66X45 STRL (DRAPES) ×3 IMPLANT
DRAPE SHEET LG 3/4 BI-LAMINATE (DRAPES) ×3 IMPLANT
DRAPE U-SHAPE 47X51 STRL (DRAPES) ×3 IMPLANT
DURAPREP 26ML APPLICATOR (WOUND CARE) ×12 IMPLANT
ELECT REM PT RETURN 9FT ADLT (ELECTROSURGICAL) ×3
ELECTRODE REM PT RTRN 9FT ADLT (ELECTROSURGICAL) ×1 IMPLANT
GAUZE PETRO XEROFOAM 1X8 (MISCELLANEOUS) ×3 IMPLANT
GAUZE SPONGE 4X4 12PLY STRL (GAUZE/BANDAGES/DRESSINGS) ×3 IMPLANT
GLOVE BIO SURGEON STRL SZ 6.5 (GLOVE) ×4 IMPLANT
GLOVE BIO SURGEONS STRL SZ 6.5 (GLOVE) ×2
GLOVE BIOGEL PI IND STRL 9 (GLOVE) ×1 IMPLANT
GLOVE BIOGEL PI INDICATOR 9 (GLOVE) ×2
GLOVE INDICATOR 7.5 STRL GRN (GLOVE) ×3 IMPLANT
GLOVE SURG 9.0 ORTHO LTXF (GLOVE) ×9 IMPLANT
GOWN STRL REUS TWL 2XL XL LVL4 (GOWN DISPOSABLE) ×3 IMPLANT
GOWN STRL REUS W/ TWL LRG LVL3 (GOWN DISPOSABLE) ×1 IMPLANT
GOWN STRL REUS W/TWL LRG LVL3 (GOWN DISPOSABLE) ×2
IV LACTATED RINGER IRRG 3000ML (IV SOLUTION) ×28
IV LR IRRIG 3000ML ARTHROMATIC (IV SOLUTION) ×14 IMPLANT
KIT RM TURNOVER STRD PROC AR (KITS) ×3 IMPLANT
KIT STABILIZATION SHOULDER (MISCELLANEOUS) ×3 IMPLANT
KIT SUTURE 2.8 Q-FIX DISP (MISCELLANEOUS) ×3 IMPLANT
KIT SUTURETAK 3.0 INSERT PERC (KITS) IMPLANT
MANIFOLD NEPTUNE II (INSTRUMENTS) ×3 IMPLANT
MASK FACE SPIDER DISP (MASK) ×3 IMPLANT
MAT BLUE FLOOR 46X72 FLO (MISCELLANEOUS) ×9 IMPLANT
NDL SAFETY 18GX1.5 (NEEDLE) ×3 IMPLANT
NDL SAFETY 22GX1.5 (NEEDLE) ×3 IMPLANT
NDL SAFETY ECLIPSE 18X1.5 (NEEDLE) ×1 IMPLANT
NEEDLE FILTER BLUNT 18X 1/2SAF (NEEDLE) ×2
NEEDLE FILTER BLUNT 18X1 1/2 (NEEDLE) ×1 IMPLANT
NEEDLE HYPO 18GX1.5 SHARP (NEEDLE) ×2
NS IRRIG 500ML POUR BTL (IV SOLUTION) IMPLANT
PACK ARTHROSCOPY SHOULDER (MISCELLANEOUS) ×3 IMPLANT
PAD WRAPON POLAR SHDR UNIV (MISCELLANEOUS) IMPLANT
PASSER SUT CAPTURE FIRST (SUTURE) IMPLANT
SET TUBE SUCT SHAVER OUTFL 24K (TUBING) ×3 IMPLANT
SET TUBE TIP INTRA-ARTICULAR (MISCELLANEOUS) ×3 IMPLANT
STRIP CLOSURE SKIN 1/2X4 (GAUZE/BANDAGES/DRESSINGS) ×2 IMPLANT
SUT ETHILON 4-0 (SUTURE) ×4
SUT ETHILON 4-0 FS2 18XMFL BLK (SUTURE) ×2
SUT LASSO 45 DEGREE (SUTURE) ×3 IMPLANT
SUT LASSO 90 DEG SD STR (SUTURE) IMPLANT
SUT MNCRL 4-0 (SUTURE) ×2
SUT MNCRL 4-0 27XMFL (SUTURE) ×1
SUT PDS AB 0 CT1 27 (SUTURE) ×9 IMPLANT
SUT PERFECTPASSER WHITE CART (SUTURE) IMPLANT
SUT SMART STITCH CARTRIDGE (SUTURE) ×12 IMPLANT
SUT VIC AB 0 CT1 36 (SUTURE) ×9 IMPLANT
SUT VIC AB 2-0 CT2 27 (SUTURE) ×3 IMPLANT
SUTURE ETHLN 4-0 FS2 18XMF BLK (SUTURE) ×2 IMPLANT
SUTURE MAGNUM WIRE 2X48 BLK (SUTURE) IMPLANT
SUTURE MNCRL 4-0 27XMF (SUTURE) ×1 IMPLANT
SYR 5ML LL (SYRINGE) ×3 IMPLANT
SYRINGE 10CC LL (SYRINGE) ×3 IMPLANT
TAPE MICROFOAM 4IN (TAPE) ×3 IMPLANT
TUBING ARTHRO INFLOW-ONLY STRL (TUBING) ×3 IMPLANT
TUBING CONNECTING 10 (TUBING) IMPLANT
TUBING CONNECTING 10' (TUBING)
WAND HAND CNTRL MULTIVAC 90 (MISCELLANEOUS) ×3 IMPLANT
WRAPON POLAR PAD SHDR UNIV (MISCELLANEOUS)

## 2015-06-16 NOTE — Anesthesia Preprocedure Evaluation (Signed)
Anesthesia Evaluation  Patient identified by MRN, date of birth, ID band Patient awake    Reviewed: Allergy & Precautions, NPO status , Patient's Chart, lab work & pertinent test results  Airway Mallampati: III       Dental  (+) Teeth Intact   Pulmonary neg pulmonary ROS,    breath sounds clear to auscultation       Cardiovascular Exercise Tolerance: Good hypertension, Pt. on medications  Rhythm:Regular Rate:Normal     Neuro/Psych negative neurological ROS     GI/Hepatic negative GI ROS, Neg liver ROS,   Endo/Other  negative endocrine ROS  Renal/GU negative Renal ROS     Musculoskeletal negative musculoskeletal ROS (+)   Abdominal Normal abdominal exam  (+)   Peds negative pediatric ROS (+)  Hematology negative hematology ROS (+) anemia ,   Anesthesia Other Findings   Reproductive/Obstetrics                             Anesthesia Physical Anesthesia Plan  ASA: II  Anesthesia Plan: General   Post-op Pain Management:    Induction: Intravenous  Airway Management Planned: Oral ETT  Additional Equipment:   Intra-op Plan:   Post-operative Plan: Extubation in OR  Informed Consent: I have reviewed the patients History and Physical, chart, labs and discussed the procedure including the risks, benefits and alternatives for the proposed anesthesia with the patient or authorized representative who has indicated his/her understanding and acceptance.     Plan Discussed with: CRNA  Anesthesia Plan Comments:         Anesthesia Quick Evaluation

## 2015-06-16 NOTE — Anesthesia Procedure Notes (Addendum)
Anesthesia Regional Block:  Interscalene brachial plexus block  Pre-Anesthetic Checklist: ,, timeout performed,, Correct Site, Correct Laterality, Correct Procedure, Correct Position, site marked, risks and benefits discussed, Surgical consent,  Pre-op evaluation,  At surgeon's request and post-op pain management  Laterality: Right  Prep: alcohol swabs       Needles:  Injection technique: Single-shot  Needle Type: Stimulator Needle - 80     Needle Length: 9cm 9 cm Needle Gauge: 22 and 22 G  Needle insertion depth: 3 cm   Additional Needles:  Procedures: nerve stimulator Interscalene brachial plexus block  Nerve Stimulator or Paresthesia:  Response: yes, 0.4 mA, 1 ms,   Additional Responses:   Narrative:  Start time: 06/16/2015 7:45 AM End time: 06/16/2015 7:51 AM  Performed by: Personally  Anesthesiologist: Elijio MilesVAN STAVEREN, GIJSBERTUS F   Procedure Name: Intubation Date/Time: 06/16/2015 8:09 AM Performed by: Tonia GhentOOK-MARTIN, Dorin Stooksbury Pre-anesthesia Checklist: Patient identified, Suction available, Emergency Drugs available, Patient being monitored and Timeout performed Patient Re-evaluated:Patient Re-evaluated prior to inductionOxygen Delivery Method: Circle system utilized Preoxygenation: Pre-oxygenation with 100% oxygen Intubation Type: IV induction Ventilation: Mask ventilation without difficulty Laryngoscope Size: Miller and 3 Grade View: Grade II Tube type: Oral Tube size: 7.5 mm Airway Equipment and Method: Stylet Placement Confirmation: positive ETCO2,  CO2 detector and breath sounds checked- equal and bilateral Secured at: 22 cm Tube secured with: Tape Dental Injury: Teeth and Oropharynx as per pre-operative assessment

## 2015-06-16 NOTE — Discharge Instructions (Signed)
AMBULATORY SURGERY  DISCHARGE INSTRUCTIONS   1) The drugs that you were given will stay in your system until tomorrow so for the next 24 hours you should not:  A) Drive an automobile B) Make any legal decisions C) Drink any alcoholic beverage  2) You may resume regular meals tomorrow.  Today it is better to start with liquids and gradually work up to solid foods.  You may eat anything you prefer, but it is better to start with liquids, then soup and crackers, and gradually work up to solid foods.   3) Please notify your doctor immediately if you have any unusual bleeding, trouble breathing, redness and pain at the surgery site, drainage, fever, or pain not relieved by medication.   4) Additional Instructions: A STOOL SOFTENER IS RECOMMENDED WHILE TAKING PAIN MEDICATION  Please contact your physician with any problems or Same Day Surgery at [phone], Monday through Friday 6 am to 4 pm, or [facility] [facility] Main number at [phone].

## 2015-06-16 NOTE — Op Note (Signed)
06/16/2015  11:05 AM  PATIENT:  Charles Alvarado    PRE-OPERATIVE DIAGNOSIS:  Anterior labral tear/bony Bankart lesion, right shoulder  POST-OPERATIVE DIAGNOSIS:  Same  PROCEDURE:  right shoulder arthroscopic repair of labral tear  SURGEON:  Juanell Fairly, MD  ANESTHESIA:   General  PREOPERATIVE INDICATIONS:  Charles Alvarado is a  45 y.o. male with a diagnosis of labral tear right shoulder s/p fall at work who failed conservative measures and elected for surgical management.    The risks benefits and alternatives were discussed with the patient preoperatively including but not limited to the risks of infection, bleeding, nerve injury, cardiopulmonary complications, the need for revision surgery, among others, and the patient was willing to proceed.  OPERATIVE IMPLANTS: Arthrex BioSurtureTak anchors x 3.  OPERATIVE FINDINGS: Anterior bony Bankart lesion/capsulolabral tear  OPERATIVE PROCEDURE: The patient was brought to the operating room and placed in the supine position. General anesthesia was administered along with an interscalene block. 2 g of IV  were given.  Examination under anesthesia was performed.  Patient had significant stiffness of the shoulder. Initially he can achieve approximately 120 of forward elevation, 60 of external rotation in abduction and 30 of external rotation in adduction.  A closed manipulation was performed which allowed the patient to achieve 150 of forward elevation, 90 of external rotation and abduction and 60 of external rotation and adduction. Patient cannot be passively dislocated on exam he did not have excessive anterior or posterior translation. He had a negative sulcus sign.  Patient was then prepped and draped in a sterile fashion. The patient was placed in a beach chair position.  A Spider arm positioner was used for this case.  A time out was performed to verify the patient's name, date of birth, medical record number, correct  site of surgery and correct procedure to be performed.  The time out was also used to confirm the patient had received antibiotics and that all instruments, implants and radiographic studies were available in the room.  Once all in attendance were in agreement, the case began.   Bony landmarks were drawn out with a surgical marker. Proposed arthroscopy incisions were also indicated with a marker. These were pre-injected 1% lidocaine plain. An 11 blade was used to establish a posterior portal through which the arthroscope was placed and is unable joint. An anterior portal was then established using an 18-gauge spinal needle for localization. A 5.75 mm arthroscopic cannula was placed in a standard anterior portal position. Diagnostic examination of the shoulder was undertaken. Patient no evidence of SLAP tear. The biceps tendon was intact. The glenohumeral joint is located. There is no posterior labral tear. The rotator cuff was intact. He is a small Hill-Sachs lesion which was non-engaging a. Patient had an intact subscapularis. There is no subluxation of the biceps tendon. Patient had an anterior labral tear step-off. A second anterolateral portal was then established again using a 18-gauge spinal needle for localization. Through this a 7 mm cannula was placed. An arthroscopic elevator was then used 2 mobilized the anterior labral tear including the bony Bankart lesion. Care was taken to adequately mobilize the anterior soft tissue to allow for adequate reduction of the anterior labrum and bony glenoid fragment. A 4.0 resector shaver blade was then used to debride scar tissue within the interval between the glenoid and labrum. The anterior glenoid was burred to allow for punctate bleeding to encourage bone healing.  3 Arthrex bio suturetck anchors were then placed along  the anterior inferior glenoid from the 5:00 to 3:00 position. A 90 suture lasso was used to pass 1 limb of each of these anchors under the  labrum. The superiormost anchor was passed through the bone fragment as the bone was thin enough to penetrate through bone with the 90 suture lasso. All bio suture tack anchors were then tied down using an arthroscopic knot tying technique. These anchors were placed sequentially one at a time starting with the inferior most anchor. Patient had a good anterior labral bumper established at the conclusion of the repair. Patient demonstrated no subluxation or ability to dislocate following the repair.  All arthroscopic instruments were then removed after final arthroscopic images were taken. The 3 arthroscopic portals were closed with 4-0 nylon. Dry sterile dressing was applied along with TENS unit leads, a Polar Care sleeve and an abduction sling. Patient was awoken and brought to the PACU in stable condition. I was scrubbed and present for the entire case and all sharp and instrument counts were correct at the conclusion the case. Spoke with the patient's wife in the postop consultation room to let her know the case was performed without complication and her husband was stable in recovery room.   Charles GenreKevin L Doyle Kunath,MD

## 2015-06-16 NOTE — Anesthesia Postprocedure Evaluation (Signed)
Anesthesia Post Note  Patient: Charles Alvarado  Procedure(s) Performed: Procedure(s) (LRB): right shoulder arthroscopic repair of labral tear (Right)  Patient location during evaluation: PACU Anesthesia Type: General Level of consciousness: awake Pain management: satisfactory to patient Vital Signs Assessment: post-procedure vital signs reviewed and stable Respiratory status: nonlabored ventilation Cardiovascular status: stable Anesthetic complications: no    Last Vitals:  Filed Vitals:   06/16/15 0623 06/16/15 1101  BP: 171/102   Pulse: 73 70  Temp: 37.1 C 36.4 C  Resp: 14     Last Pain: There were no vitals filed for this visit.               VAN STAVEREN,Adilynn Bessey

## 2015-06-16 NOTE — Transfer of Care (Signed)
Immediate Anesthesia Transfer of Care Note  Patient: Charles Alvarado  Procedure(s) Performed: Procedure(s): right shoulder arthroscopic repair of labral tear (Right)  Patient Location: PACU  Anesthesia Type:General  Level of Consciousness: awake and sedated  Airway & Oxygen Therapy: Patient Spontanous Breathing and Patient connected to face mask oxygen  Post-op Assessment: Report given to RN and Post -op Vital signs reviewed and stable  Post vital signs: Reviewed and stable  Last Vitals:  Filed Vitals:   06/16/15 0623  BP: 171/102  Pulse: 73  Temp: 37.1 C  Resp: 14    Last Pain: There were no vitals filed for this visit.       Complications: No apparent anesthesia complications

## 2015-06-16 NOTE — H&P (Signed)
The patient has been re-examined, and the chart reviewed, and there have been no interval changes to the documented history and physical.    The risks, benefits, and alternatives have been discussed at length, and the patient is willing to proceed.   

## 2015-06-21 ENCOUNTER — Other Ambulatory Visit: Payer: Self-pay

## 2015-06-21 NOTE — Addendum Note (Signed)
Addendum  created 06/21/15 1550 by Stormy FabianLinda Raynetta Osterloh, CRNA   Modules edited: Anesthesia Responsible Staff

## 2015-07-27 ENCOUNTER — Encounter: Payer: BLUE CROSS/BLUE SHIELD | Admitting: Family Medicine

## 2017-03-04 IMAGING — DX DG SHOULDER 2+V PORT*R*
1 series · 1 of 1 positions shown · non-contrast
Comparison: Plain films right shoulder earlier today appear

CLINICAL DATA: Status post reduction of right shoulder dislocation.
Continued pain. Initial encounter.

EXAM:
PORTABLE RIGHT SHOULDER - 2+ VIEW

[shoulder ap]
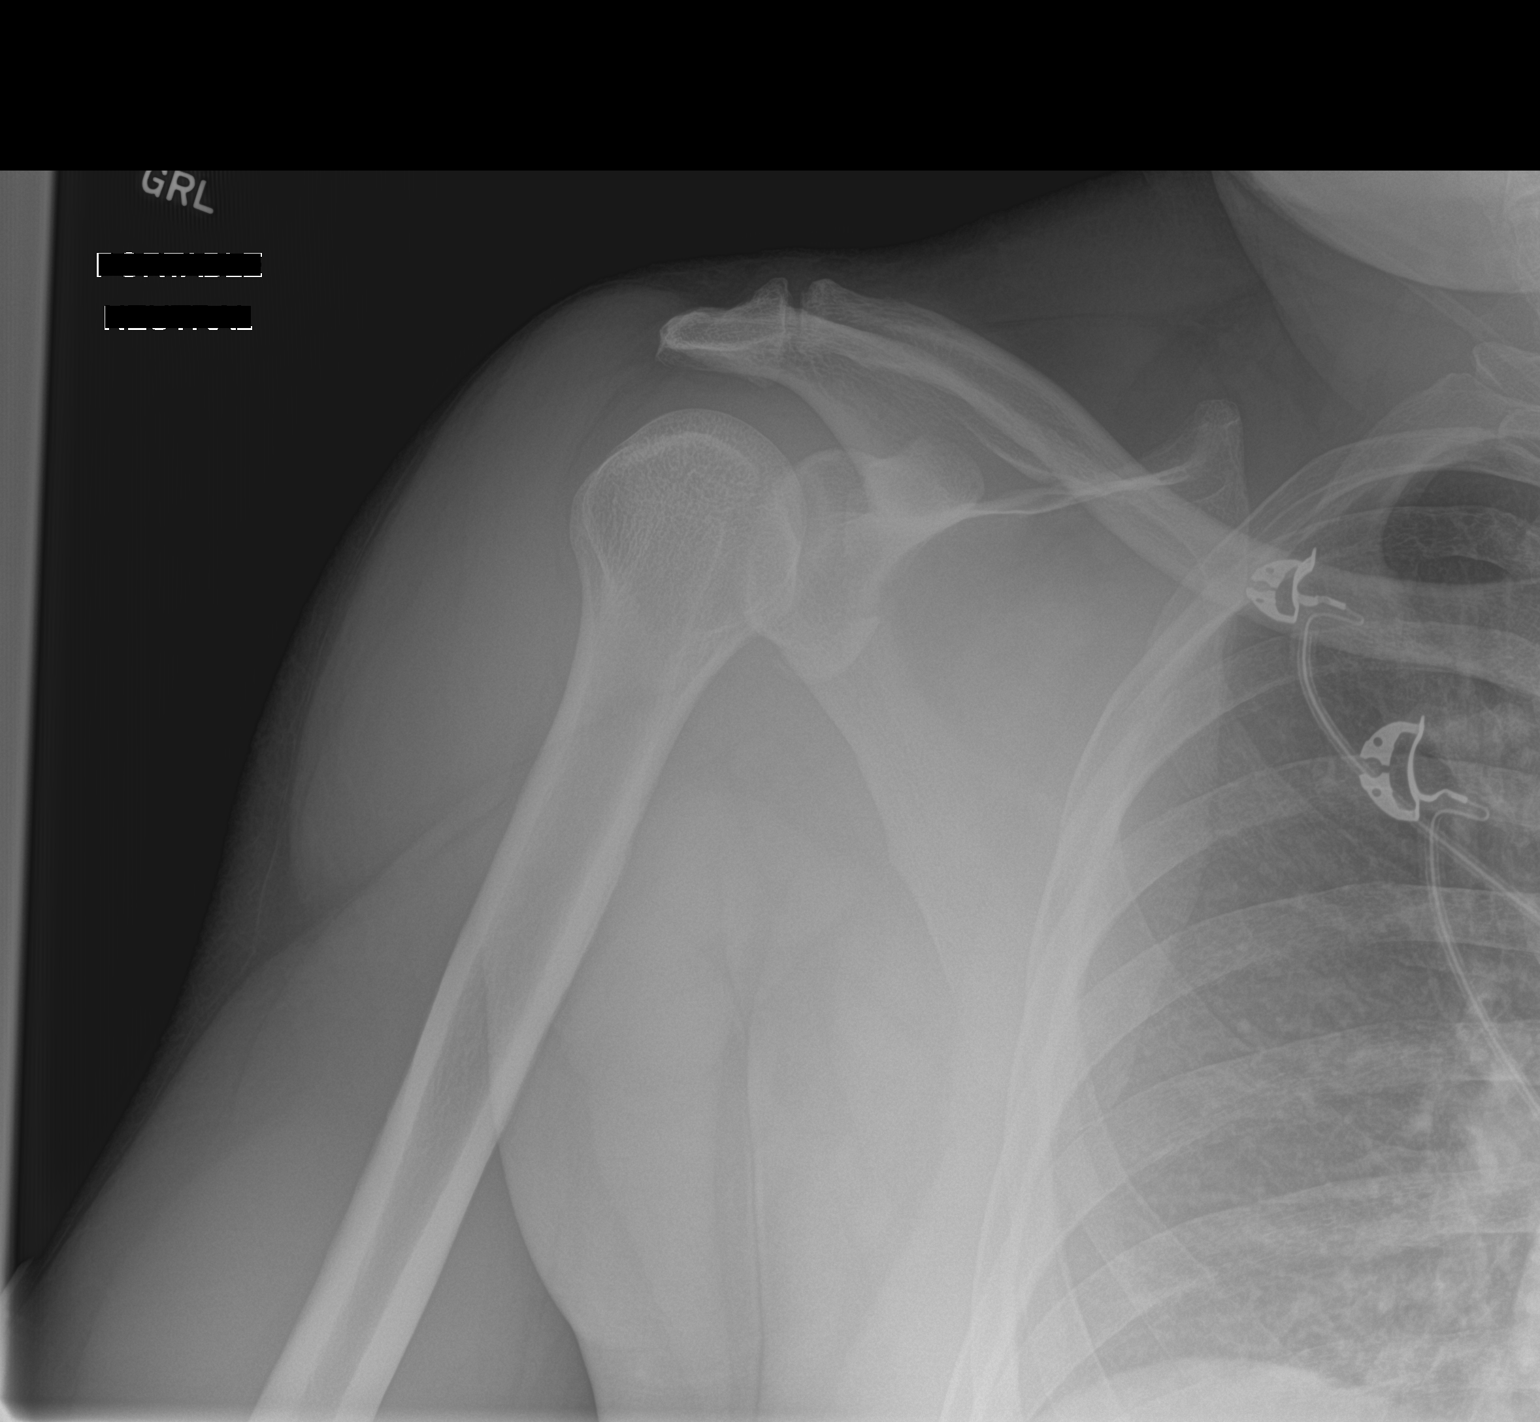

[1 of 1 positions shown; findings below may reference images not displayed]

FINDINGS: The right humerus appears reduced. No new abnormality is identified.
No fracture seen. Acromioclavicular osteoarthritis is noted.
IMPRESSION: The right shoulder appears reduced.  No new abnormality

## 2017-03-04 IMAGING — CT CT MAXILLOFACIAL W/O CM
3 series · 14 of 47 positions shown, 16 images · non-contrast
Comparison: None.

CLINICAL DATA: Fall today, hit head on some equipment, right side
of jaw pain

EXAM:
CT MAXILLOFACIAL WITHOUT CONTRAST
TECHNIQUE: Multidetector CT imaging of the maxillofacial structures was
performed. Multiplanar CT image reconstructions were also generated.
A small metallic BB was placed on the right temple in order to
reliably differentiate right from left.

[Series 3: max soft · axial · 0.34mm/px · z∈[-228,-60]mm · 8 of 98 slices shown, 10 images]
[im 7/98  brain]
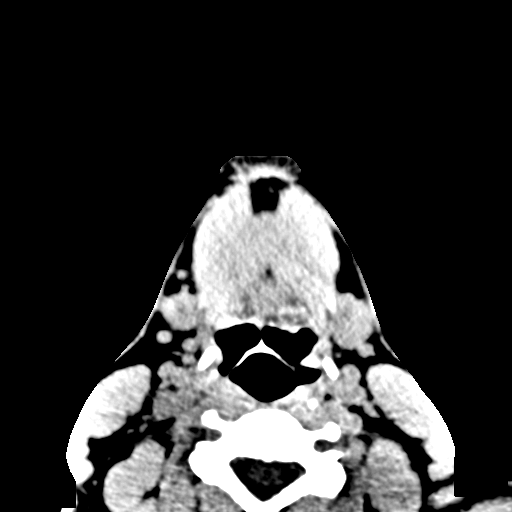
[im 7/98  bone]
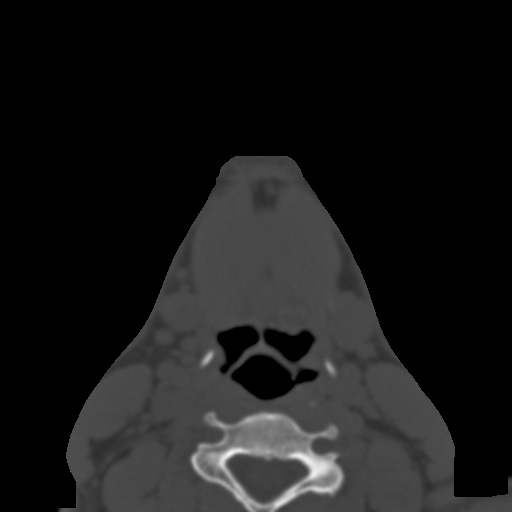
[im 21/98  bone]
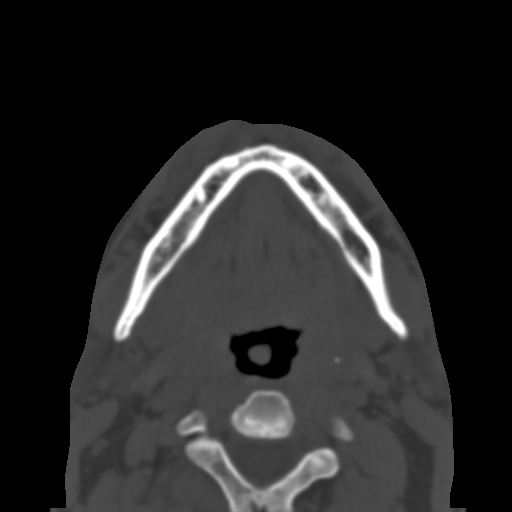
[im 31/98  bone]
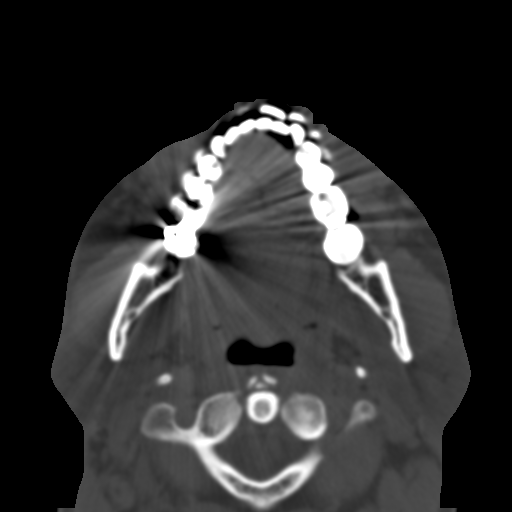
[im 44/98  bone]
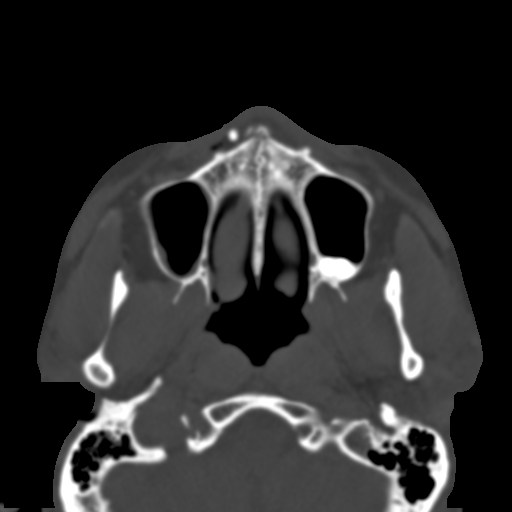
[im 54/98  brain]
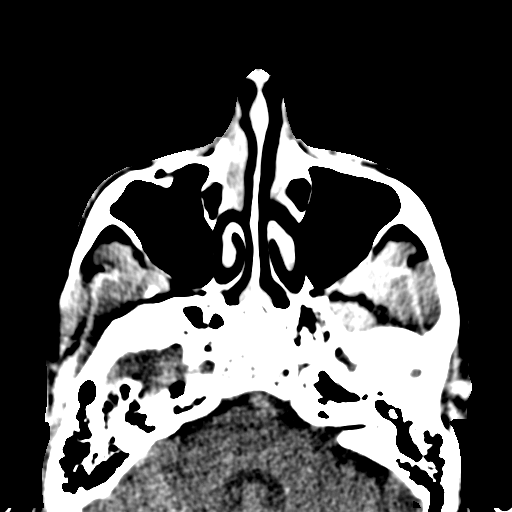
[im 54/98  bone]
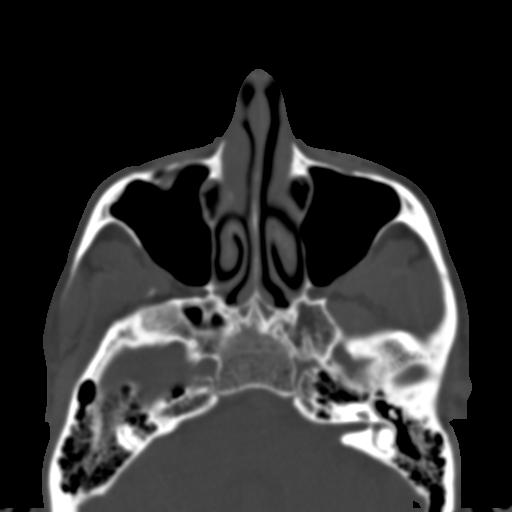
[im 67/98  bone]
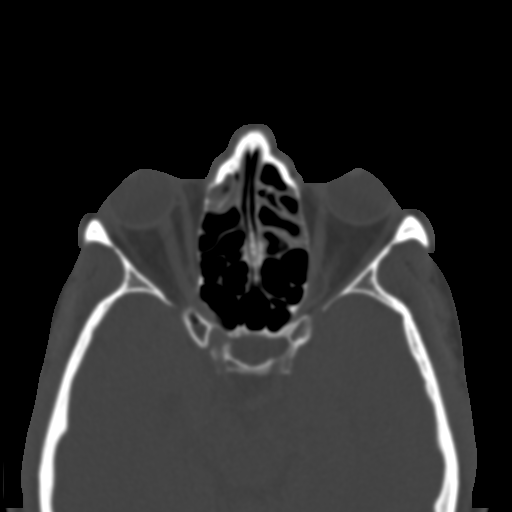
[im 77/98  bone]
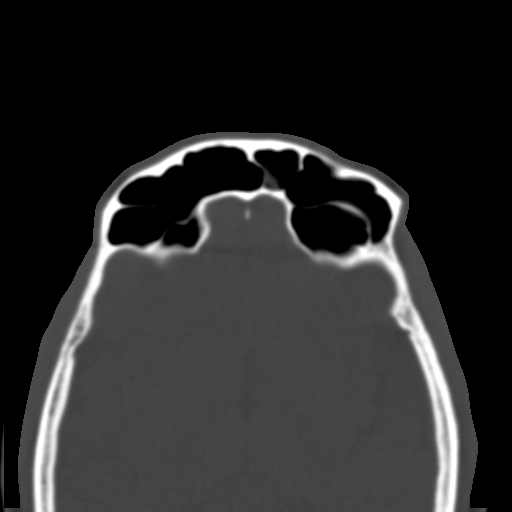
[im 91/98  bone]
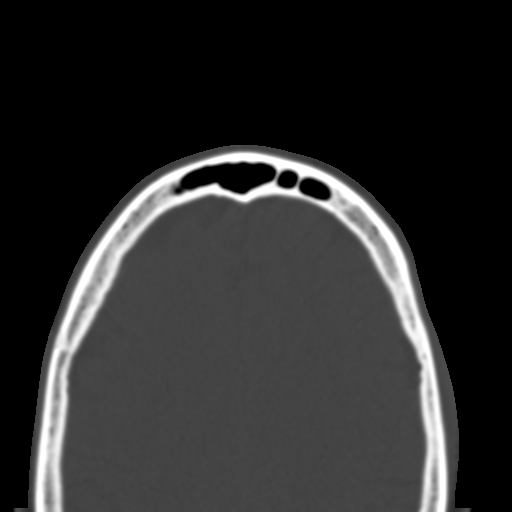

[Series 4: coronal soft · coronal · 0.31mm/px · 3 of 73 slices shown]
[im 25/73  bone]
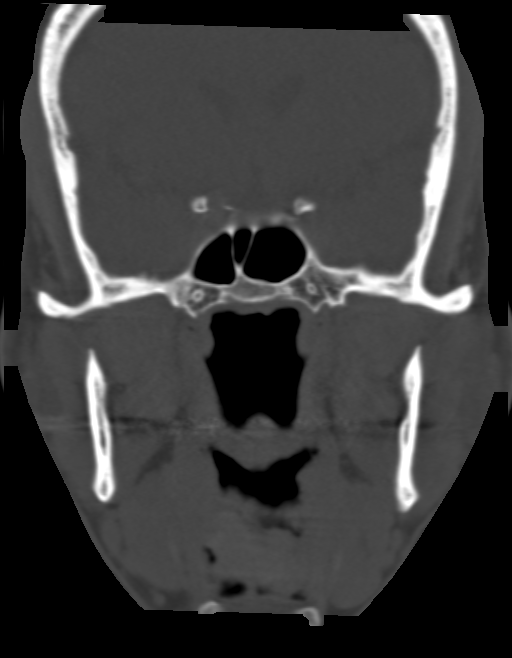
[im 33/73  bone]
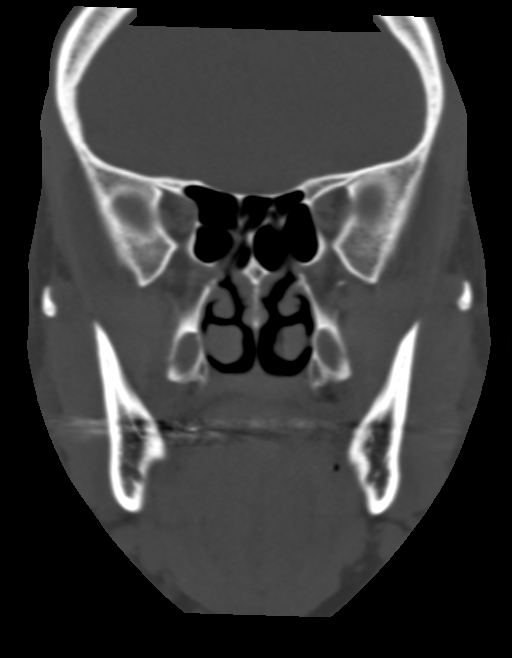
[im 41/73  bone]
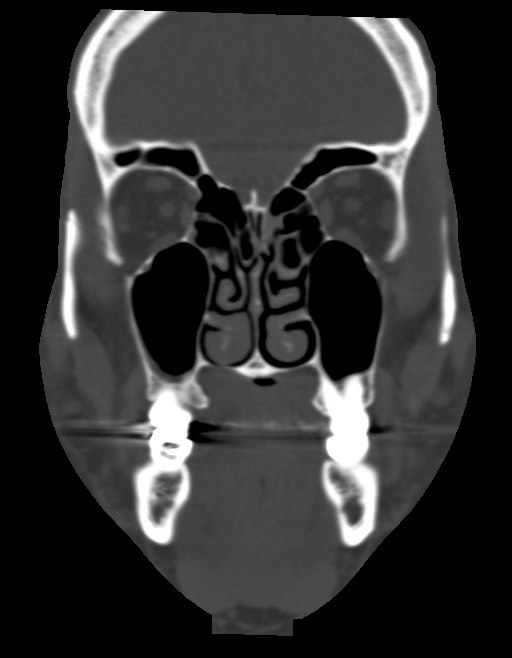

[Series 5: sagittal soft · sagittal · 0.31mm/px · 3 of 83 slices shown]
[im 28/83  bone]
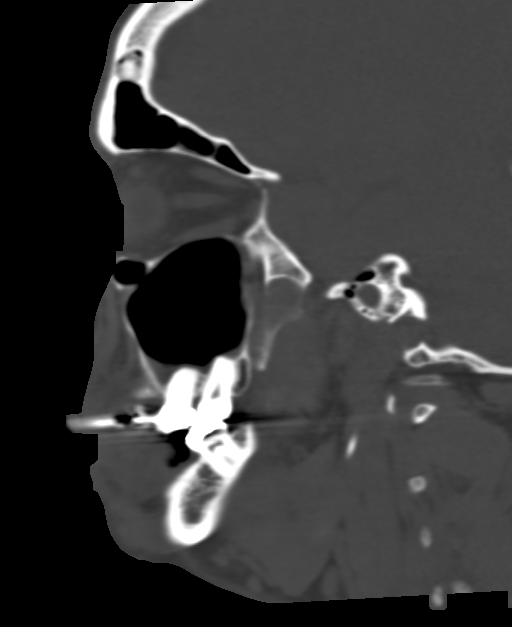
[im 42/83  bone]
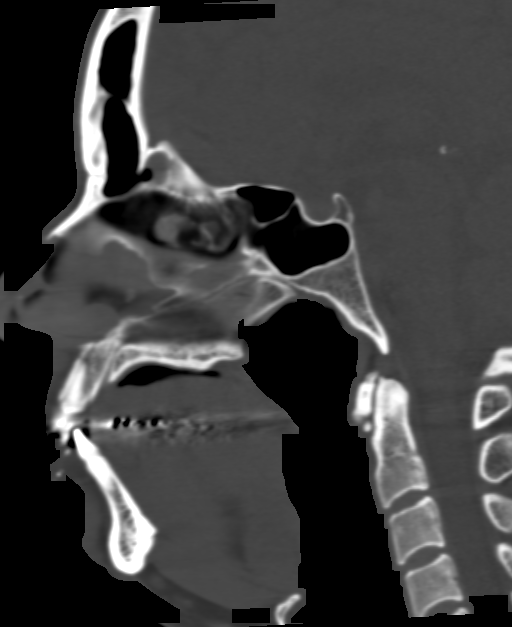
[im 55/83  bone]
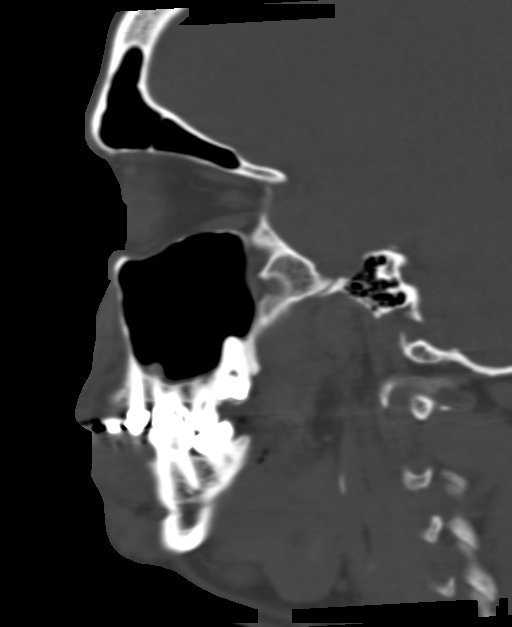

[14 of 47 positions shown; findings below may reference images not displayed]

FINDINGS: There is scalp swelling and mild subcutaneous stranding in right
frontal region. Tiny amount of subcutaneous air noted right frontal
scalp axial image 17. There is mild mucosal thickening inferior
aspect bilateral frontal sinus. Mild mucosal thickening with partial
opacification bilateral ethmoid air cells. Mild mucosal thickening
inferior aspect of the right maxillary sinus. The sphenoid sinuses
are unremarkable.

No nasal bone fracture is noted. There is no evidence of zygomatic
fracture.

Coronal images shows bilateral semilunar canal patent. Minimal
mucosal thickening noted inferior aspect left maxillary sinus. The
nasal turbinates are unremarkable.

No orbital rim or orbital floor fracture. Metallic dental artifact
are noted. No evidence of mandibular fracture. No TMJ dislocation.

Sagittal images shows mild degenerative changes C1-C2 articulation.
No maxillary spine fracture is noted. The nasopharyngeal and
oropharyngeal airway is patent. No prevertebral soft tissue
swelling.

There is no intraorbital hematoma. Bilateral eye globes are
symmetrical in appearance.
IMPRESSION: 1. Paranasal sinuses disease as described above. No facial fractures
are noted.
2. No intraorbital hematoma. No orbital rim or orbital floor
fracture.
3. There is scalp swelling and subcutaneous stranding in right
frontal region. Tiny amount of subcutaneous air in right frontal
scalp.
4. No evidence of mandibular fracture.  No TMJ dislocation.
5. Patent nasopharyngeal and oropharyngeal airway.

## 2017-03-04 IMAGING — CR DG SHOULDER 2+V*R*
3 series · 3 of 3 positions shown · non-contrast
Comparison: No priors.

CLINICAL DATA: 44-year-old male with history of trauma from a fall
onto the right side while at work today. Unable to move right
shoulder.

EXAM:
RIGHT SHOULDER - 2+ VIEW

[shoulder grashey (1 of 2)]
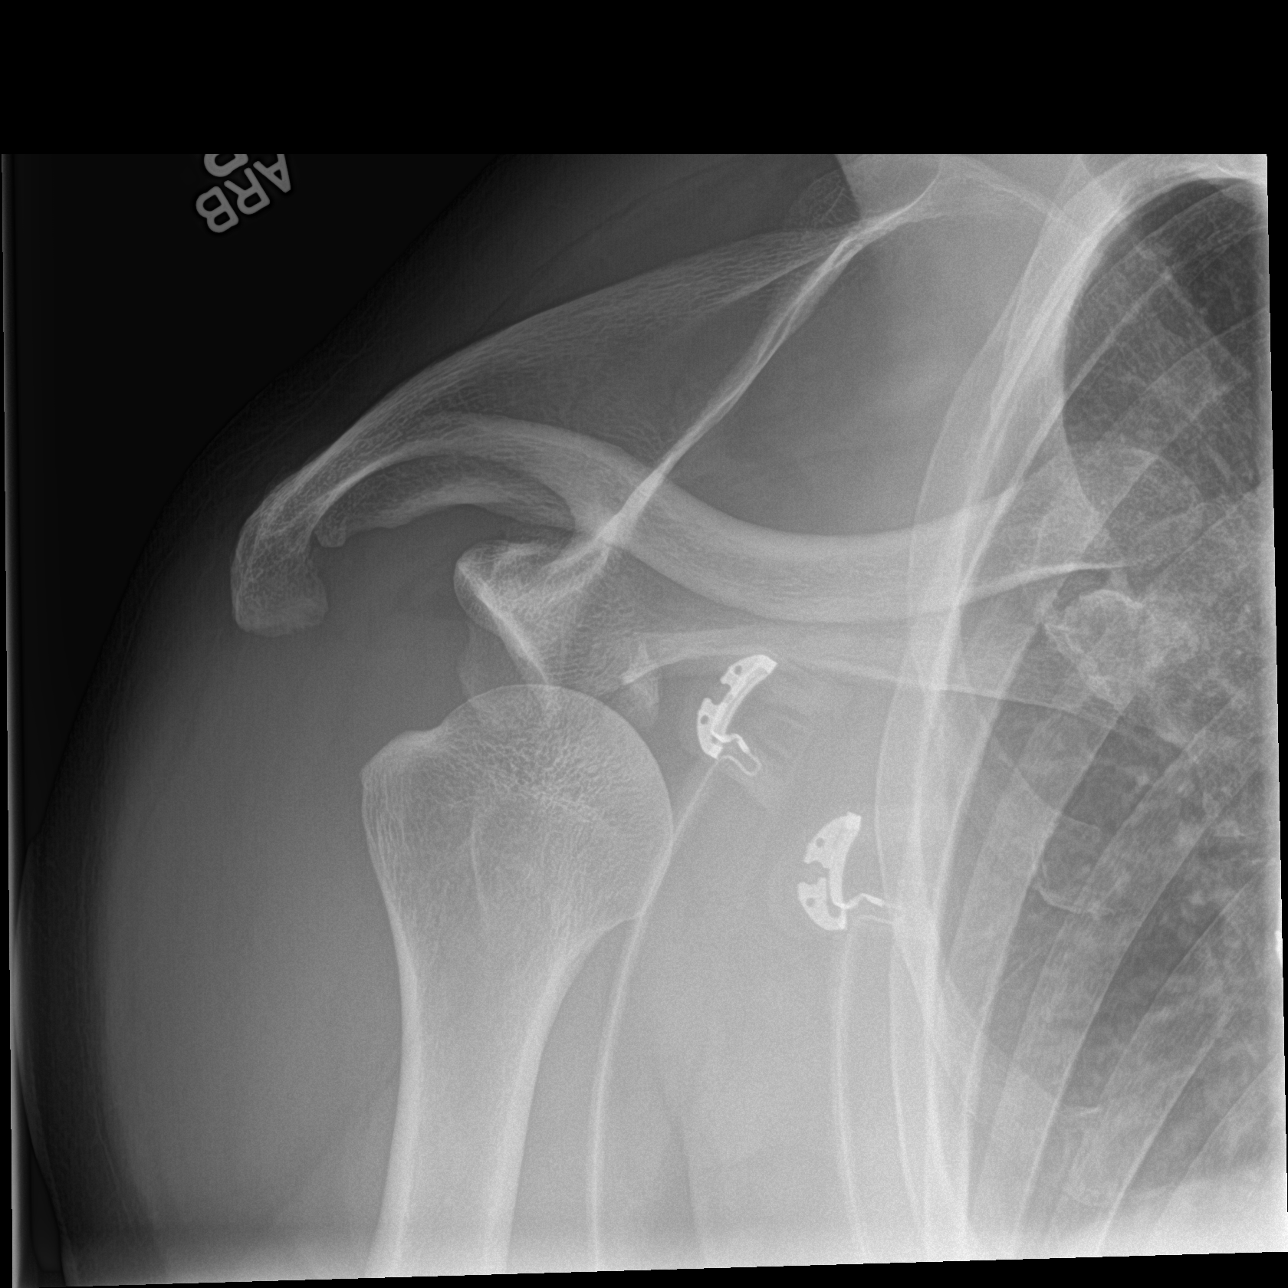

[shoulder y view]
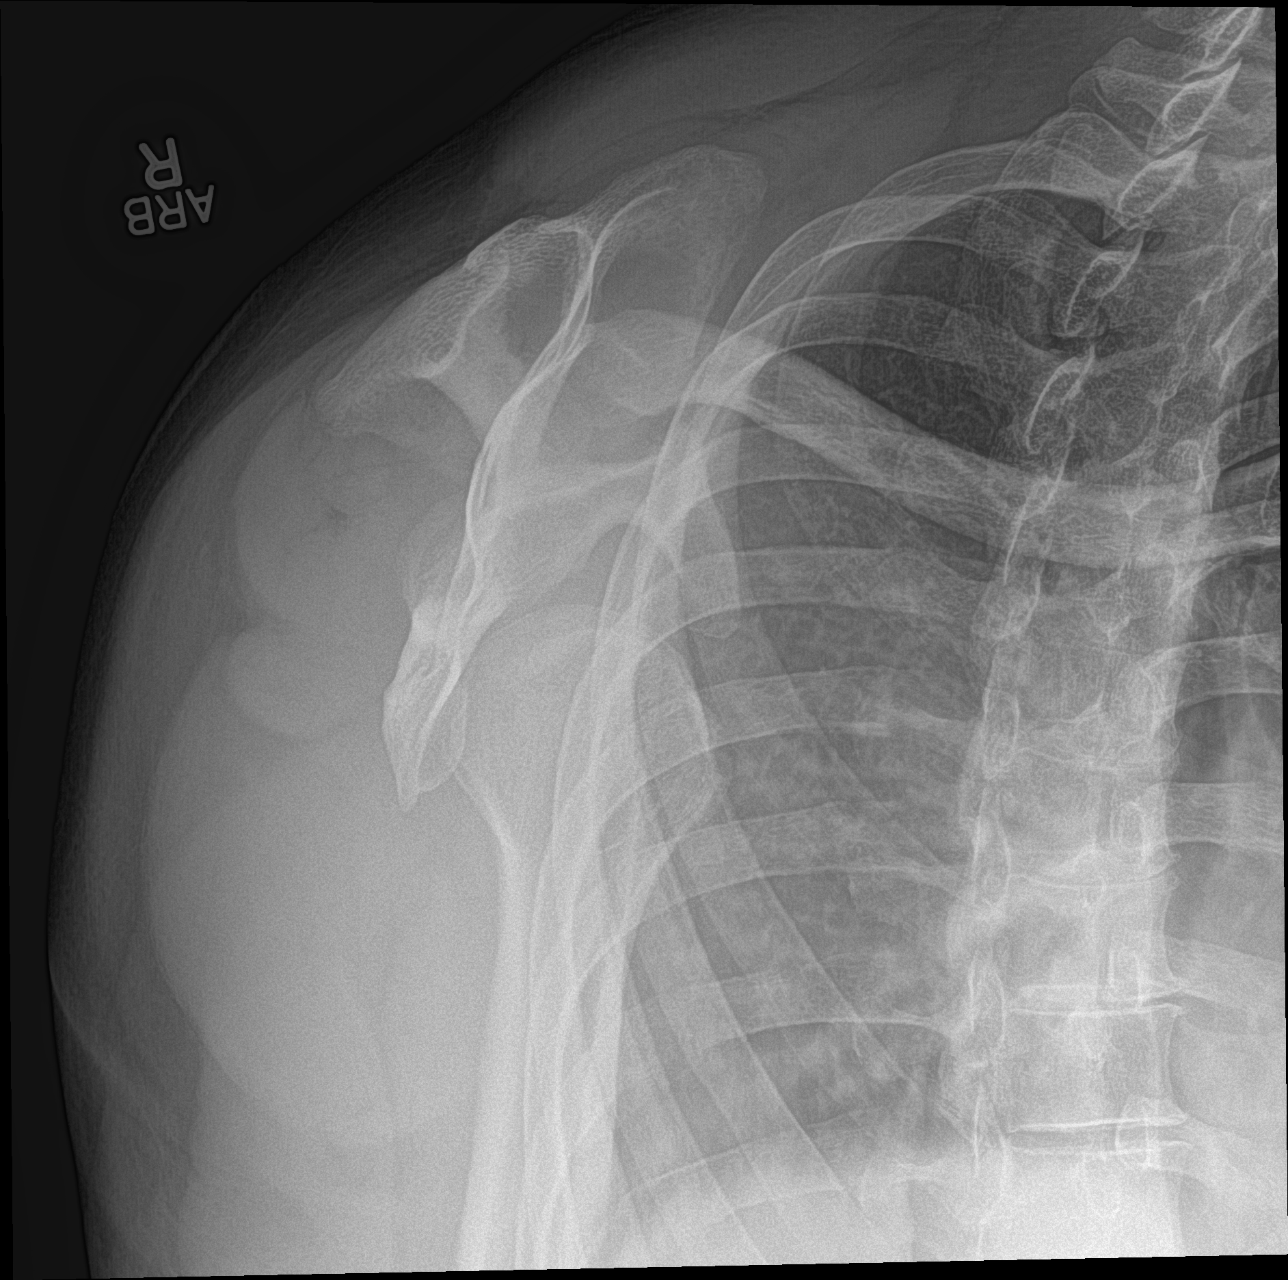

[shoulder grashey (2 of 2)]
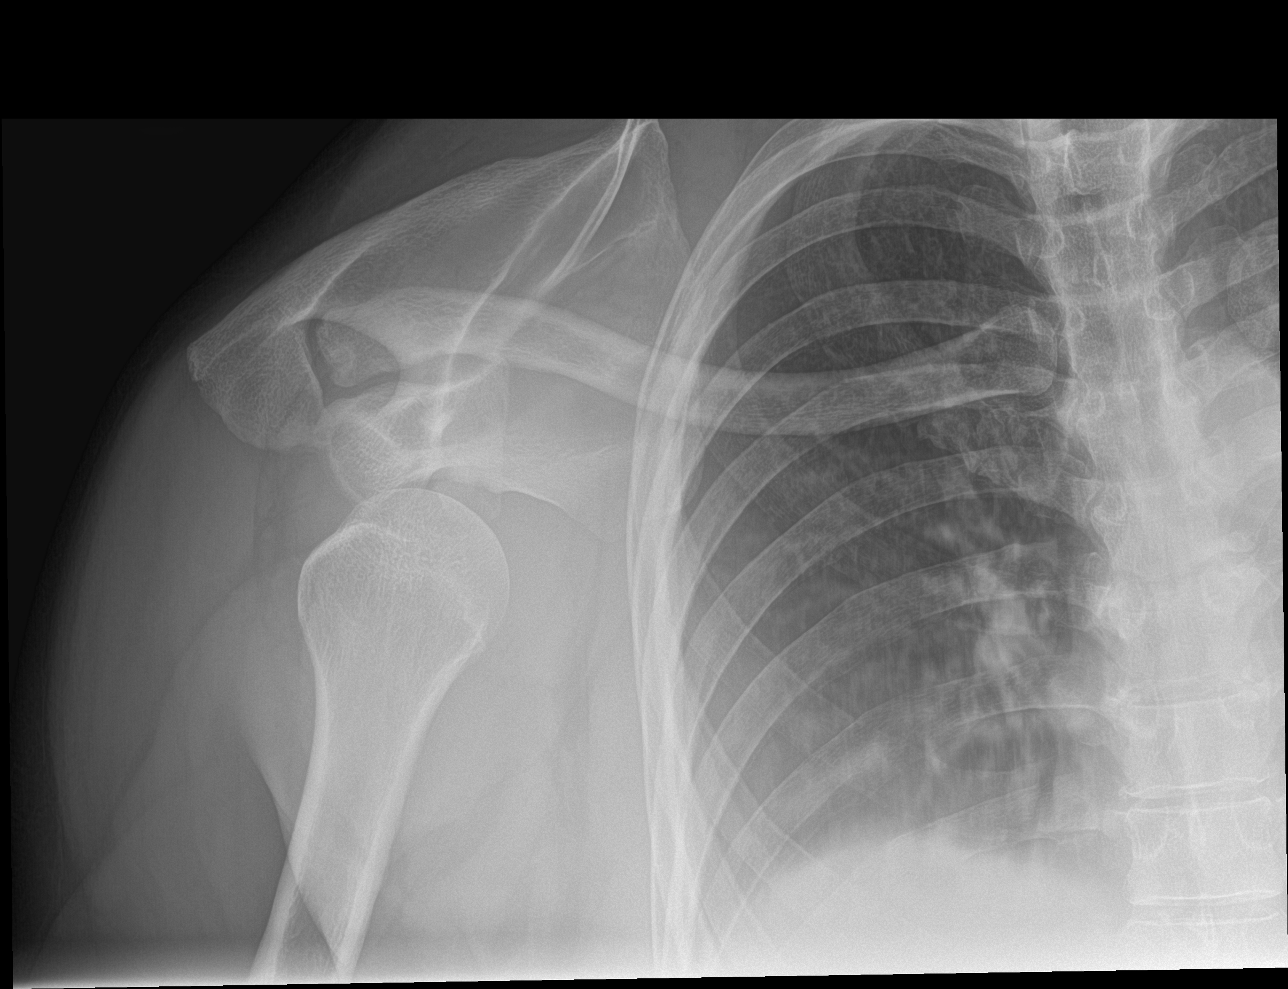

[3 of 3 positions shown; findings below may reference images not displayed]

FINDINGS: Anterior subcoracoid shoulder dislocation. No definite acute
displaced fracture identified. Acromioclavicular joint appears
intact.
IMPRESSION: 1. Anterior subcoracoid right shoulder dislocation.

## 2018-09-12 DIAGNOSIS — I639 Cerebral infarction, unspecified: Secondary | ICD-10-CM

## 2018-09-12 HISTORY — DX: Cerebral infarction, unspecified: I63.9

## 2018-09-13 DIAGNOSIS — I679 Cerebrovascular disease, unspecified: Secondary | ICD-10-CM | POA: Insufficient documentation

## 2018-09-16 ENCOUNTER — Telehealth: Payer: Self-pay | Admitting: Emergency Medicine

## 2018-09-16 NOTE — Telephone Encounter (Signed)
Copied from [place] [id number]. Topic: Appointment Scheduling - Scheduling Inquiry for Clinic >> Sep 16, 2018  1:46 PM Reyne Dumas L wrote: Reason for CRM:   Pt had a stroke over the weekend and was told to follow up with PCP.  Pt doesn't have a PCP and has been waiting on a new pt appointment with Raelyn Ensign (already scheduled in September).  Pt needs to know what to do.  Pt states that the hospital put him out of work and stated that he needed to be seen by PCP prior to going back to work. Pt can be reached at [phone]

## 2018-09-16 NOTE — Telephone Encounter (Signed)
This patient originally had appointment schedule for September 3. Patient now need hospital follow up before then

## 2018-09-16 NOTE — Telephone Encounter (Signed)
Discussed with Bonnita Nasuti, will work him in this Thursday.

## 2018-09-18 ENCOUNTER — Ambulatory Visit: Payer: BLUE CROSS/BLUE SHIELD | Admitting: Family Medicine

## 2018-09-18 ENCOUNTER — Encounter: Payer: Self-pay | Admitting: Family Medicine

## 2018-09-18 ENCOUNTER — Other Ambulatory Visit: Payer: Self-pay

## 2018-09-18 VITALS — BP 126/82 | HR 70 | Temp 97.9°F | Resp 18 | Ht 74.0 in | Wt 204.8 lb

## 2018-09-18 DIAGNOSIS — D649 Anemia, unspecified: Secondary | ICD-10-CM

## 2018-09-18 DIAGNOSIS — I69354 Hemiplegia and hemiparesis following cerebral infarction affecting left non-dominant side: Secondary | ICD-10-CM | POA: Diagnosis not present

## 2018-09-18 LAB — CBC WITH DIFFERENTIAL/PLATELET
Absolute Monocytes: 306 cells/uL (ref 200–950)
Basophils Absolute: 61 cells/uL (ref 0–200)
Basophils Relative: 1.3 %
Eosinophils Absolute: 127 cells/uL (ref 15–500)
Eosinophils Relative: 2.7 %
HCT: 42.6 % (ref 38.5–50.0)
Hemoglobin: 14.4 g/dL (ref 13.2–17.1)
Lymphs Abs: 1518 cells/uL (ref 850–3900)
MCH: 28.1 pg (ref 27.0–33.0)
MCHC: 33.8 g/dL (ref 32.0–36.0)
MCV: 83.2 fL (ref 80.0–100.0)
MPV: 9.6 fL (ref 7.5–12.5)
Monocytes Relative: 6.5 %
Neutro Abs: 2688 cells/uL (ref 1500–7800)
Neutrophils Relative %: 57.2 %
Platelets: 254 10*3/uL (ref 140–400)
RBC: 5.12 10*6/uL (ref 4.20–5.80)
RDW: 13.1 % (ref 11.0–15.0)
Total Lymphocyte: 32.3 %
WBC: 4.7 10*3/uL (ref 3.8–10.8)

## 2018-09-18 LAB — COMPLETE METABOLIC PANEL WITH GFR
AG Ratio: 1.5 (calc) (ref 1.0–2.5)
ALT: 38 U/L (ref 9–46)
AST: 28 U/L (ref 10–40)
Albumin: 4.6 g/dL (ref 3.6–5.1)
Alkaline phosphatase (APISO): 91 U/L (ref 36–130)
BUN: 19 mg/dL (ref 7–25)
CO2: 29 mmol/L (ref 20–32)
Calcium: 10.3 mg/dL (ref 8.6–10.3)
Chloride: 102 mmol/L (ref 98–110)
Creat: 1.07 mg/dL (ref 0.60–1.35)
GFR, Est African American: 95 mL/min/{1.73_m2} (ref >=60)
GFR, Est Non African American: 82 mL/min/{1.73_m2} (ref >=60)
Globulin: 3 g/dL (calc) (ref 1.9–3.7)
Glucose, Bld: 95 mg/dL (ref 65–99)
Potassium: 4.7 mmol/L (ref 3.5–5.3)
Sodium: 138 mmol/L (ref 135–146)
Total Bilirubin: 0.4 mg/dL (ref 0.2–1.2)
Total Protein: 7.6 g/dL (ref 6.1–8.1)

## 2018-09-18 NOTE — Patient Instructions (Addendum)
Your LDL is above normal.  The LDL is the "lousy" or bad cholesterol. Over time and in combination with inflammation and other factors, this contributes to plaque which in turn may lead to stroke and/or heart attack down the road.  Sometimes high LDL is primarily genetic, and people might be eating all the right foods but still have high numbers.  Other times, there is room for improvement in one's diet and eating healthier can bring this number down and potentially reduce one's risk of heart attack and/or stroke.  Your LDL level should be below 100. If you have diabetes or a possible heart problem, your LDL should be below 70.  Some strategies to focus on to help improve your LDL levels:  - Eat 20 to 30 grams of fiber every day.  - Eat Foods such as fruits and vegetables, whole grains, beans, peas, nuts, and seeds can help lower LDL. - Avoid Saturated fats - Dairy foods - such as butter, cream, ghee, regular-fat milk and cheese. Meat - such as fatty cuts of beef, pork and lamb, processed meats like salami, sausages and the skin on chicken. Lard., fatty snack foods, cakes, biscuits, pies and deep fried foods) - Avoid smoking    Fat and Cholesterol Restricted Eating Plan Getting too much fat and cholesterol in your diet may cause health problems. Choosing the right foods helps keep your fat and cholesterol at normal levels. This can keep you from getting certain diseases. Your doctor may recommend an eating plan that includes:  Total fat: ______% or less of total calories a day.  Saturated fat: ______% or less of total calories a day.  Cholesterol: less than _________mg a day.  Fiber: ______g a day. What are tips for following this plan? Meal planning  At meals, divide your plate into four equal parts: ? Fill one-half of your plate with vegetables and green salads. ? Fill one-fourth of your plate with whole grains. ? Fill one-fourth of your plate with low-fat (lean) protein foods.  Eat  fish that is high in omega-3 fats at least two times a week. This includes mackerel, tuna, sardines, and salmon.  Eat foods that are high in fiber, such as whole grains, beans, apples, broccoli, carrots, peas, and barley. General tips   Work with your doctor to lose weight if you need to.  Avoid: ? Foods with added sugar. ? Fried foods. ? Foods with partially hydrogenated oils.  Limit alcohol intake to no more than 1 drink a day for nonpregnant women and 2 drinks a day for men. One drink equals 12 oz of beer, 5 oz of wine, or 1 oz of hard liquor. Reading food labels  Check food labels for: ? Trans fats. ? Partially hydrogenated oils. ? Saturated fat (g) in each serving. ? Cholesterol (mg) in each serving. ? Fiber (g) in each serving.  Choose foods with healthy fats, such as: ? Monounsaturated fats. ? Polyunsaturated fats. ? Omega-3 fats.  Choose grain products that have whole grains. Look for the word "whole" as the first word in the ingredient list. Cooking  Cook foods using low-fat methods. These include baking, boiling, grilling, and broiling.  Eat more home-cooked foods. Eat at restaurants and buffets less often.  Avoid cooking using saturated fats, such as butter, cream, palm oil, palm kernel oil, and coconut oil. Recommended foods  Fruits  All fresh, canned (in natural juice), or frozen fruits. Vegetables  Fresh or frozen vegetables (raw, steamed, roasted, or grilled). Green salads.   Grains  Whole grains, such as whole wheat or whole grain breads, crackers, cereals, and pasta. Unsweetened oatmeal, bulgur, barley, quinoa, or brown rice. Corn or whole wheat flour tortillas. Meats and other protein foods  Ground beef (85% or leaner), grass-fed beef, or beef trimmed of fat. Skinless chicken or turkey. Ground chicken or turkey. Pork trimmed of fat. All fish and seafood. Egg whites. Dried beans, peas, or lentils. Unsalted nuts or seeds. Unsalted canned beans. Nut  butters without added sugar or oil. Dairy  Low-fat or nonfat dairy products, such as skim or 1% milk, 2% or reduced-fat cheeses, low-fat and fat-free ricotta or cottage cheese, or plain low-fat and nonfat yogurt. Fats and oils  Tub margarine without trans fats. Light or reduced-fat mayonnaise and salad dressings. Avocado. Olive, canola, sesame, or safflower oils. The items listed above may not be a complete list of foods and beverages you can eat. Contact a dietitian for more information. Foods to avoid Fruits  Canned fruit in heavy syrup. Fruit in cream or butter sauce. Fried fruit. Vegetables  Vegetables cooked in cheese, cream, or butter sauce. Fried vegetables. Grains  White bread. White pasta. White rice. Cornbread. Bagels, pastries, and croissants. Crackers and snack foods that contain trans fat and hydrogenated oils. Meats and other protein foods  Fatty cuts of meat. Ribs, chicken wings, bacon, sausage, bologna, salami, chitterlings, fatback, hot dogs, bratwurst, and packaged lunch meats. Liver and organ meats. Whole eggs and egg yolks. Chicken and turkey with skin. Fried meat. Dairy  Whole or 2% milk, cream, half-and-half, and cream cheese. Whole milk cheeses. Whole-fat or sweetened yogurt. Full-fat cheeses. Nondairy creamers and whipped toppings. Processed cheese, cheese spreads, and cheese curds. Beverages  Alcohol. Sugar-sweetened drinks such as sodas, lemonade, and fruit drinks. Fats and oils  Butter, stick margarine, lard, shortening, ghee, or bacon fat. Coconut, palm kernel, and palm oils. Sweets and desserts  Corn syrup, sugars, honey, and molasses. Candy. Jam and jelly. Syrup. Sweetened cereals. Cookies, pies, cakes, donuts, muffins, and ice cream. The items listed above may not be a complete list of foods and beverages you should avoid. Contact a dietitian for more information. Summary  Choosing the right foods helps keep your fat and cholesterol at normal levels.  This can keep you from getting certain diseases.  At meals, fill one-half of your plate with vegetables and green salads.  Eat high-fiber foods, like whole grains, beans, apples, carrots, peas, and barley.  Limit added sugar, saturated fats, alcohol, and fried foods. This information is not intended to replace advice given to you by your health care provider. Make sure you discuss any questions you have with your health care provider. Document Released: 07/24/2011 Document Revised: 09/25/2017 Document Reviewed: 10/09/2016 Elsevier Patient Education  2020 Elsevier Inc.   

## 2018-09-18 NOTE — Progress Notes (Signed)
Name: Charles Alvarado   MRN: [id number]    DOB: 1970/06/12   Date:09/18/2018       Progress Note  Subjective  Chief Complaint  Chief Complaint  Patient presents with  . Establish Care  . Hospitalization Follow-up    Stroke patient was there for over night observation. He stated since the stroke he has been having bad headaches, balance off, confused, left side weak, no energy    HPI  Pt presents to establish care and for hospital FU s/p Ischemic Stroke affecting the LEFT side of the body, also intracranial vascular stenosis and HTN.  [facility] and discharged 09/13/2018.  His wife accompanies him today and assists.   HTN: BP is at goal today; taking 10mg  Lisinopril. Denies chest pain, shortness of breath, BLE edema.  Has been having headaches since discharge from the hospital.  Ischemic Stroke with LLE Weakness and LLE/LUE Numbness: Taking plavix 75mg  (recommendations as to stop plavix after 90 days - 12/12/2018), atorvastatin 80mg , aspirin 81mg  daily. No signs of bleeding.  Has been having intermittent headaches - pain is 7-8/10, takes tylenol and takes a nap and this seems to take care of the pain; pain is posterior, pain is described as throbbing. He has not had any PT set up, but is willing to see them. Still having fatigue after discharge.  He is still having some intermittent confusion as well.  HLD: LDL was 188, taking atorvastatin now and tolerating without myalgias or concerns.  Prediabetes: A1C 5.8% in the hospital on 09/13/2018.  Discussed diet changes. Denies polyuria, polydipsia, or polyphagia.  He was written out of work until 10/06/2018, he has CT scan of head scheduled for tomorrow but no additional follow [facility] with [facility] at this time.  Advised to remain OOW until [facility] has follow [facility] and clears him to return to work.  His job involves [employer] and is very detail oriented, and he has been having intermittent confusion that has  been residual since the stroke.  Patient Active Problem List   Diagnosis Date Noted  . Heart murmur 02/21/2015  . Hypertension 02/21/2015  . Hyperlipidemia 02/21/2015    Past Surgical History:  Procedure Laterality Date  . CLOSED REDUCTION SHOULDER DISLOCATION  06-2015  . SHOULDER ARTHROSCOPY WITH OPEN ROTATOR CUFF REPAIR AND DISTAL CLAVICLE ACROMINECTOMY Right 06/16/2015   Procedure: right shoulder arthroscopic repair of labral tear;  Surgeon: Juanell FairlyKevin Krasinski, MD;  Location: [facility] ORS;  Service: Orthopedics;  Laterality: Right;    Family History  Problem Relation Age of Onset  . Hypertension Mother   . Hypertension Father   . Diabetes Father   . Diabetes Sister   . Hypertension Sister     Social History   Socioeconomic History  . Marital status: Married    Spouse name: Marylene Landngela  . Number of children: 0  . Years of education: Not on file  . Highest education level: Not on file  Occupational History  . [place]ation: [employer]  Social Needs  . Financial resource strain: Not hard at all  . Food insecurity    Worry: Never true    Inability: Never true  . Transportation needs    Medical: No    Non-medical: No  Tobacco Use  . Smoking status: Never Smoker  . Smokeless tobacco: Never Used  Substance and Sexual Activity  . Alcohol use: No    Alcohol/week: 0.0 standard drinks  . Drug use: No  . Sexual activity: Yes  Partners: Female  Lifestyle  . Physical activity    Days per week: 0 days    Minutes per session: 0 min  . Stress: Only a little  Relationships  . Social connections    Talks on phone: More than three times a week    Gets together: Never    Attends religious service: More than 4 times per year    Active member of club or organization: Yes    Attends meetings of clubs or organizations: 1 to 4 times per year    Relationship status: Married  . Intimate partner violence    Fear of current or ex partner: No    Emotionally abused: No    Physically abused: No     Forced sexual activity: No  Other Topics Concern  . Not on file  Social History Narrative  . Not on file     Current Outpatient Medications:  .  aspirin 81 MG chewable tablet, Chew by mouth., Disp: , Rfl:  .  atorvastatin (LIPITOR) 80 MG tablet, Take by mouth., Disp: , Rfl:  .  clopidogrel (PLAVIX) 75 MG tablet, Take by mouth., Disp: , Rfl:  .  lisinopril (PRINIVIL,ZESTRIL) 20 MG tablet, Take 20 mg by mouth every morning. Taking 1/2 a tablet qd, Disp: , Rfl:  .  oxyCODONE (OXY IR/ROXICODONE) 5 MG immediate release tablet, Take 1-2 tablets (5-10 mg total) by mouth every 4 (four) hours as needed for severe pain., Disp: 60 tablet, Rfl: 0 .  ibuprofen (ADVIL,MOTRIN) 800 MG tablet, Take 800 mg by mouth every 8 (eight) hours as needed., Disp: , Rfl:  .  ondansetron (ZOFRAN) 4 MG tablet, Take 1 tablet (4 mg total) by mouth every 8 (eight) hours as needed for nausea or vomiting. (Patient not taking: Reported on 09/18/2018), Disp: 30 tablet, Rfl: 0  No Active Allergies  I personally reviewed active problem list, medication list, allergies, notes from last encounter, lab results with the patient/caregiver today.   ROS  Constitutional: Negative for fever or weight change.  Respiratory: Negative for cough and shortness of breath.   Cardiovascular: Negative for chest pain or palpitations.  Gastrointestinal: Negative for abdominal pain, no bowel changes.  Musculoskeletal: Negative for gait problem or joint swelling.  Skin: Negative for rash.  Neurological: Negative for dizziness. +headache, some confusion.  No other specific complaints in a complete review of systems (except as listed in HPI above)  Objective  Vitals:   09/18/18 0829  BP: 126/82  Pulse: 70  Resp: 18  Temp: 97.9 F (36.6 C)  TempSrc: Oral  SpO2: 98%  Weight: 204 lb 12.8 oz (92.9 kg)  Height: 6\' 2"  (1.88 m)    Body mass index is 26.29 kg/m.  Physical Exam Constitutional: Patient appears well-developed and  well-nourished. No distress.  HENT: Head: Normocephalic and atraumatic. Ears: bilateral TMs with no erythema or effusion; Nose: Nose normal. Mouth/Throat: Oropharynx is clear and moist. No oropharyngeal exudate or tonsillar swelling.  Eyes: Conjunctivae and EOM are normal. No scleral icterus.  Pupils are equal, round, and reactive to light.  Neck: Normal range of motion. Neck supple. No JVD present. No thyromegaly present.  Cardiovascular: Normal rate, regular rhythm and normal heart sounds.  No murmur heard. No BLE edema. Pulmonary/Chest: Effort normal and breath sounds normal. No respiratory distress. Abdominal: Soft. Bowel sounds are normal, no distension. There is no tenderness. No masses. Musculoskeletal: Normal range of motion, no joint effusions. No gross deformities Neurological: Pt is alert and oriented to person,  place, and time. No cranial nerve deficit. Coordination, balance, speech and gait is steady.  Strength is weaker on the LEFT grip and LLE, however left upper extremity is 5/5 strength.  RUE and RLE 5/5. Skin: Skin is warm and dry. No rash noted. No erythema.  Psychiatric: Patient has a normal mood and affect. behavior is normal. Judgment and thought content normal.  No results found for this or any previous visit (from the past 72 hour(s)).   PHQ2/9: Depression screen [facility] 2/9 09/18/2018 04/13/2015 02/21/2015 02/21/2015  Decreased Interest 0 0 0 0  Down, Depressed, Hopeless 0 0 0 0  PHQ - 2 Score 0 0 0 0  Altered sleeping 1 - - -  Tired, decreased energy 1 - - -  Change in appetite 0 - - -  Feeling bad or failure about yourself  0 - - -  Trouble concentrating 1 - - -  Moving slowly or fidgety/restless 1 - - -  Suicidal thoughts 0 - - -  PHQ-9 Score 4 - - -  Difficult doing work/chores Somewhat difficult - - -   PHQ-2/9 Result is negative.    Fall Risk: Fall Risk  09/18/2018 04/13/2015 02/21/2015 02/21/2015  Falls in the past year? 0 No No No  Number falls in past yr: 0 - -  -  Injury with Fall? 0 - - -  Follow up Falls evaluation completed - - -    Assessment & Plan  1. 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 Mild anemia - CBC with Differential/Platelet

## 2018-09-21 NOTE — Addendum Note (Signed)
Addended by: Hubbard Hartshorn on: 09/21/2018 08:05 PM   Modules accepted: Level of Service

## 2018-09-28 ENCOUNTER — Encounter: Payer: Self-pay | Admitting: Family Medicine

## 2018-09-28 DIAGNOSIS — I69354 Hemiplegia and hemiparesis following cerebral infarction affecting left non-dominant side: Secondary | ICD-10-CM

## 2018-09-29 NOTE — Addendum Note (Signed)
Addended by: Hubbard Hartshorn on: 09/29/2018 03:20 PM   Modules accepted: Orders

## 2018-10-02 ENCOUNTER — Encounter: Payer: Self-pay | Admitting: Family Medicine

## 2018-10-02 ENCOUNTER — Ambulatory Visit: Payer: BLUE CROSS/BLUE SHIELD | Attending: Family Medicine | Admitting: Physical Therapy

## 2018-10-02 ENCOUNTER — Encounter: Payer: Self-pay | Admitting: Physical Therapy

## 2018-10-02 ENCOUNTER — Other Ambulatory Visit: Payer: Self-pay

## 2018-10-02 DIAGNOSIS — R2681 Unsteadiness on feet: Secondary | ICD-10-CM | POA: Insufficient documentation

## 2018-10-02 DIAGNOSIS — M6281 Muscle weakness (generalized): Secondary | ICD-10-CM

## 2018-10-02 NOTE — Patient Instructions (Signed)
Access Code: [email or web address]  URL: [email or web address]  Date: 10/02/2018  Prepared by: Blanche East   Exercises  Standing Repeated Hip Abduction with Resistance - 15 reps - 2 sets - 1x daily - 7x weekly  Standing Repeated Hip Extension with Resistance - 15 reps - 2 sets - 1x daily - 7x weekly  Standing Hip Flexion with Resistance Loop - 15 reps - 2 sets - 1x daily - 7x weekly  Side Stepping with Resistance at Feet - 3 reps - 2 sets - 1x daily - 7x weekly  Sit to Stand - 10 reps - 2 sets - 1x daily - 7x weekly

## 2018-10-02 NOTE — Therapy (Signed)
[facility] MAIN [facility] SERVICES [address] [place], [place], [address] Phone: [phone]   Fax:  [phone]  Physical Therapy Evaluation  Patient Details  Name: Charles Alvarado MRN: [id number] Date of Birth: Dec 29, 1970 Referring Provider (PT): Maurice SmallEmily Boyce FNP   Encounter Date: 10/02/2018  PT End of Session - 10/02/18 0907    Visit Number  1    Number of Visits  5    Date for PT Re-Evaluation  10/30/18    PT Start Time  0802    PT Stop Time  0857    PT Time Calculation (min)  55 min    Equipment Utilized During Treatment  Gait belt    Activity Tolerance  Patient tolerated treatment well    Behavior During Therapy  [facility] for tasks assessed/performed       Past Medical History:  Diagnosis Date  . Anemia    H/O AS A CHILD  . Blood clots in brain   . Claustrophobia   . Heart murmur    ASYMPTOMATIC-RECENTLY DIAGNOSED 3 MONTHS  AGO(03-2015) BY PCP  . Hyperlipidemia   . Hypertension   . Stroke [facility](HCC)     Past Surgical History:  Procedure Laterality Date  . CLOSED REDUCTION SHOULDER DISLOCATION  06-2015  . SHOULDER ARTHROSCOPY WITH OPEN ROTATOR CUFF REPAIR AND DISTAL CLAVICLE ACROMINECTOMY Right 06/16/2015   Procedure: right shoulder arthroscopic repair of labral tear;  Surgeon: Juanell FairlyKevin Krasinski, MD;  Location: [facility] ORS;  Service: Orthopedics;  Laterality: Right;    There were no vitals filed for this visit.   Subjective Assessment - 10/02/18 0808    Subjective  "I just feel so tired and weak."    Pertinent History  48 yo Male s/p CVA with left sided weakness on 09/12/18; He reports most of his strength has returned but he is still having some numbness and tingling on left side of body particularly in LUE hand and LLE. He reports feeling very tired and weak, reporting that he has lost all his stamina. 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 He has driven some   but very limited. He denies any pain; He denies any recent falls. He presents to therapy without AD, walking independently;    Limitations  Standing;Walking    How long can you sit comfortably?  reports increased numbness with prolonged sitting;    How long can you stand comfortably?  Feels off balance in standing; limited to <30 min, has to hold onto things for balance;    How long can you walk comfortably?  about 500 feet;    Diagnostic tests  CT shows subacute right parieto-occipital infarct    Patient Stated Goals  "get back to normal."  PLOF included weight lifting, able to bench press 225#    Currently in Pain?  No/denies    Multiple Pain Sites  No         [facility] PT Assessment - 10/02/18 0001      Assessment   Medical Diagnosis  s/p CVA, left side weakness    Referring Provider (PT)  Maurice SmallEmily Boyce FNP    Onset Date/Surgical Date  09/12/18    Hand Dominance  Right    Next MD Visit  November 2020    Prior Therapy  denies any PT for this condition; had PT in the past for shoulder pain with good results      Precautions   Precautions  None      Restrictions   Weight Bearing Restrictions  No      Balance Screen   Has the patient fallen in the past 6 months  No    Has the patient had a decrease in activity level because of a fear of falling?   No    Is the patient reluctant to leave their home because of a fear of falling?   No      Home Environment   Additional Comments  Lives in 2 story home, 17 steps to 2nd floor, bed/bath on 2nd floor; able to negotiate reciprocally but descends non-reciprocal with rail assist; mod I for self care ADLs      Prior Function   Level of Independence  Independent    Vocation  Full time employment    Vocation Requirements  will need to be able to lift/stand for period of time; will get that information soon    Leisure  chill out/rest; does have a 48 year old;       Cognition   Overall Cognitive Status  Impaired/Different from baseline       Observation/Other Assessments   Skin Integrity  generally intact    Cranial Nerve(s)  all present and intact      Sensation   Light Touch  Impaired by gross assessment   decreased sensitivty on LUE hand and LLE below knee,      Coordination   Gross Motor Movements are Fluid and Coordinated  Yes    Fine Motor Movements are Fluid and Coordinated  Yes    Heel Shin Test  slightly dysmetric LLE but very good on RLE      Posture/Postural Control   Posture Comments  WFL      Strength   Right Hip Flexion  5/5    Right Hip Extension  4+/5    Right Hip ABduction  5/5    Right Hip ADduction  4/5    Left Hip Flexion  4/5    Left Hip Extension  4-/5    Left Hip ABduction  4/5    Left Hip ADduction  4/5    Right/Left Knee  Right;Left    Right Knee Flexion  5/5    Right Knee Extension  5/5    Left Knee Flexion  4/5    Left Knee Extension  5/5    Right/Left Ankle  Right;Left    Right Ankle Dorsiflexion  5/5    Right Ankle Plantar Flexion  5/5    Left Ankle Dorsiflexion  4/5    Left Ankle Plantar Flexion  4/5      Transfers   Comments  able to transfer sit<>Stand independently without pushing on chair;       6 Minute Walk- Baseline   BP (mmHg)  121/80    HR (bpm)  67    02 Sat (%RA)  100 %      6 Minute walk- Post Test   BP (mmHg)  118/79    HR (bpm)  80    02 Sat (%RA)  100 %      6 minute walk test results    Aerobic Endurance Distance Walked  1430    Endurance additional comments  without AD, good reciprocal gait pattern, does report increased fatigue on left side but no foot drag noted;      Standardized Balance Assessment   Five times sit to stand comments   11 sec without HHA    10  Meter Walk  1.19 m/s without AD      High Level Balance   High Level Balance Comments  SLS: R: 3 sec, L: 5 sec; able to hold tandem stance 10 sec with eyes open, with eyes closed loses balance after 5 sec                Objective measurements completed on examination: See above  findings.    TREATMENT: Instructed patient in HEP: Standing with green tband around BLE: -hip abduction x15 bilaterally; -hip extension x15 bilaterally; -hip flexion x15 bilaterally; Side stepping x10 feet x1 lap each direction; Patient required min-moderate verbal/tactile cues for correct exercise technique. Patient able to don/doff tband independently; provided written HEP for better adherence;           PT Education - 10/02/18 0907    Education Details  HEP initiated, plan of care    Person(s) Educated  Patient;Spouse    Methods  Explanation;Demonstration;Verbal cues;Handout    Comprehension  Verbalized understanding;Returned demonstration;Verbal cues required;Need further instruction       PT Short Term Goals - 10/02/18 0911      PT SHORT TERM GOAL #1   Title  Patient will be adherent to HEP at least 3x a week to improve functional strength and balance for better safety at home.    Time  2    Period  Weeks    Status  New    Target Date  10/16/18        PT Long Term Goals - 10/02/18 0911      PT LONG TERM GOAL #1   Title  Patient will increase LLE gross strength to 4+/5 as to improve functional strength for independent gait, increased standing tolerance and increased ADL ability.    Time  4    Period  Weeks    Status  New    Target Date  10/30/18      PT LONG TERM GOAL #2   Title  Patient will increase lower extremity functional scale to >60/80 to demonstrate improved functional mobility and increased tolerance with ADLs.    Time  4    Period  Weeks    Status  New    Target Date  10/30/18      PT LONG TERM GOAL #3   Title  Patient will tolerate 5 seconds of single leg stance without loss of balance to improve ability to get in and out of shower safely.    Time  4    Period  Weeks    Status  New    Target Date  10/30/18      PT LONG TERM GOAL #4   Title  Patient will increase six minute walk test distance to >1600 for progression of gait ability  closer to age group norms    Time  4    Period  Weeks    Status  New    Target Date  10/30/18             Plan - 10/02/18 0907    Clinical Impression Statement  48 yo Male s/p CVA with left sided weakness. Imaging reveals a subacute infarct in the right parietal/occipital lobe. Patient reports some numbness in LUE hand and LLE from knee down to foot however intact light pressure and deep pressure. He does have decreased sensitivity to LLE lower leg. Patient does exhibit increased weakness in LLE however has functional strength for ADLs. He tested below age group norms  on 6 min walk test although is walking at a community ambulator pace. Patient would benefit from skilled PT Intervention to improve strength and mobility and improve functional endurance for return to PLOF.    Personal Factors and Comorbidities  Comorbidity 1    Comorbidities  HTN, controlled;    Examination-Activity Limitations  Stairs;Lift;Stand;Locomotion Level    Examination-Participation Restrictions  Cleaning;Community Activity;Driving;Shop;Volunteer;Yard Work    Conservation officer, historic buildings  Stable/Uncomplicated    Optometrist  Low    Rehab Potential  Good    PT Frequency  1x / week    PT Duration  4 weeks    PT Treatment/Interventions  Cryotherapy;Moist Heat;Gait training;Stair training;Functional mobility training;Therapeutic activities;Therapeutic exercise;Balance training;Neuromuscular re-education;Patient/family education    PT Next Visit Plan  advance HEP with balance activities    PT Home Exercise Plan  Initiated    Consulted and Agree with Plan of Care  Patient       Patient will benefit from skilled therapeutic intervention in order to improve the following deficits and impairments:  Impaired sensation, Decreased coordination, Decreased mobility, Decreased strength, Decreased endurance, Decreased activity tolerance, Decreased balance, Difficulty walking  Visit Diagnosis: Muscle  weakness (generalized)  Unsteadiness on feet     Problem List Patient Active Problem List   Diagnosis Date Noted  . Heart murmur 02/21/2015  . Hypertension 02/21/2015  . Hyperlipidemia 02/21/2015    , PT, DPT 10/02/2018, 9:14 AM  [facility] MAIN [facility] SERVICES [address] [place], [place], [address] Phone: [phone]   Fax:  [phone]  Name: Charles Alvarado MRN: [id number] Date of Birth: 07-19-1970

## 2018-10-06 ENCOUNTER — Ambulatory Visit: Payer: BLUE CROSS/BLUE SHIELD | Admitting: Physical Therapy

## 2018-10-08 ENCOUNTER — Ambulatory Visit: Payer: Self-pay | Admitting: Physical Therapy

## 2018-10-09 ENCOUNTER — Ambulatory Visit: Payer: BLUE CROSS/BLUE SHIELD | Admitting: Family Medicine

## 2018-10-10 ENCOUNTER — Ambulatory Visit: Payer: BLUE CROSS/BLUE SHIELD | Attending: Family Medicine

## 2018-10-10 ENCOUNTER — Other Ambulatory Visit: Payer: Self-pay

## 2018-10-10 VITALS — BP 146/90 | HR 85

## 2018-10-10 DIAGNOSIS — M6281 Muscle weakness (generalized): Secondary | ICD-10-CM | POA: Diagnosis not present

## 2018-10-10 DIAGNOSIS — R2681 Unsteadiness on feet: Secondary | ICD-10-CM | POA: Insufficient documentation

## 2018-10-10 NOTE — Therapy (Signed)
[facility] MAIN [facility] SERVICES [address] [place], [place], [address] Phone: [phone]   Fax:  [phone]  Physical Therapy Treatment  Patient Details  Name: Charles Alvarado MRN: [id number] Date of Birth: Jun 17, 1970 Referring Provider (PT): Maurice Small FNP   Encounter Date: 10/10/2018  PT End of Session - 10/10/18 0758    Visit Number  2    Number of Visits  5    Date for PT Re-Evaluation  10/30/18    PT Start Time  0800    PT Stop Time  0845    PT Time Calculation (min)  45 min    Equipment Utilized During Treatment  Gait belt    Activity Tolerance  Patient tolerated treatment well    Behavior During Therapy  [facility] for tasks assessed/performed       Past Medical History:  Diagnosis Date  . Anemia    H/O AS A CHILD  . Blood clots in brain   . Claustrophobia   . Heart murmur    ASYMPTOMATIC-RECENTLY DIAGNOSED 3 MONTHS  AGO(03-2015) BY PCP  . Hyperlipidemia   . Hypertension   . Stroke [facility])     Past Surgical History:  Procedure Laterality Date  . CLOSED REDUCTION SHOULDER DISLOCATION  06-2015  . SHOULDER ARTHROSCOPY WITH OPEN ROTATOR CUFF REPAIR AND DISTAL CLAVICLE ACROMINECTOMY Right 06/16/2015   Procedure: right shoulder arthroscopic repair of labral tear;  Surgeon: Juanell Fairly, MD;  Location: [facility] ORS;  Service: Orthopedics;  Laterality: Right;    Vitals:   10/10/18 0831  BP: (!) 146/90  Pulse: 85  SpO2: 100%    Subjective Assessment - 10/10/18 0805    Subjective  Pt states that he is doing well today. He states that his biggest complaint is the full body fatigue he has been experiencing since his stroke.  Pt states during session that he feels like he felt some weakness and numbness on the R side last week, but did not seek care and symptoms have resolved. He reports no soreness or pain today.  He states that the HEP is going well.  No new questions or concerns at this time.    Pertinent History  48 yo Male s/p  CVA with left sided weakness on 09/12/18; He reports most of his strength has returned but he is still having some numbness and tingling on left side of body particularly in LUE hand and LLE. He reports feeling very tired and weak, reporting that he has lost all his stamina. 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 He has driven some  but very limited. He denies any pain; He denies any recent falls. He presents to therapy without AD, walking independently;    Limitations  Standing;Walking    How long can you sit comfortably?  reports increased numbness with prolonged sitting;    How long can you stand comfortably?  Feels off balance in standing; limited to <30 min, has to hold onto things for balance;    How long can you walk comfortably?  about 500 feet;    Diagnostic tests  CT shows subacute right parieto-occipital infarct    Patient Stated Goals  "get back to normal."  PLOF included weight lifting, able to bench press 225#    Currently in Pain?  No/denies         TREATMENT:  There-ex Octane 5 min Level 2 during history Leg press #195 2x20 to  increase LE endurance  Left Leg Leg press #120; 2x20   HEP was reassessed for technique today, with pt demonstrating good understanding and technique throughout with minimal cueing required to keep his knee straight during hip extension exercise.  Standing with green tband around BLE at ankles: hip abduction x15 bilaterally; hip extension x15 bilaterally; min cueing to keep knee straight hip flexion x15 bilaterally; Side stepping x10 feet x1 lap each direction; seated L  dorsiflexion with green band x15 were added today  Neuromuscular Re-education Airex balance beam tandem walking 2x5 lengths forward and backward in // bars; 2-3 intermittent instances of SUE support on // bars.  CGA throughout.  Treadmill training 8 min total to progress a fast walk to a jog 3.7mph to 5mph to  6mph with recovery at 3mph to 2.285mph; displays good L foot clearance, but he states that he has to conciously think about dorisflexing his foot throughout.  Gait and running pattern are consistent bilaterally with equal weight shift, hip flexion, and foot clearance.    Pt educated on stroke screening symptoms with BE FAST acronym today.  Pt educated throughout session about proper posture and technique with exercises. Improved exercise technique, movement at target joints, use of target muscles after min verbal, visual, tactile cues.   Pt demonstrates excellent motivation throughout physical therapy session.  He states that his biggest concern is the full body fatigue that he has been feeling since his stroke.  Pt also stated during the session that he feels like he experienced some weakness, numbness, and tingling in his RUE and RLE last week, stating that his right hand "had a mind of its own".  Pt education about the BE FAST acronym, and advised that if he feels any unusual symptoms in the future to seek medical care promptly.  He states that his strength and sensation on the R have returned to normal at this time.  He demonstrates good postural control with airex balance beam tandem walking, only using SUE 2-3 times throughout for steadying.  Treadmill walking and jogging was also trialed today, with pt demonstrating good running technique with equal weight shift, hip flexion, step/stride length, and foot clearance throughout.  He does state that he has to consciously think about maintaining ankle dorsiflexion while jogging to achieve foot clearance.  HEP was reviewed and pt demonstrates good understanding and technique, requiring min verbal cueing to keep his knee straight on hip extension.  Seated L dorsiflexion with the green theraband was added to HEP today to increase L dorsiflexion strength and endurance.  Pt will benefit from PT services to address deficits in strength, balance, and mobility in  order to return to full function at home and work.       PT Education - 10/10/18 0859    Education Details  HEP reviewed and seated dorsiflexion was added; educated on the BE FAST acronym    Person(s) Educated  Patient    Methods  Explanation;Demonstration;Verbal cues    Comprehension  Verbalized understanding;Returned demonstration       PT Short Term Goals - 10/02/18 0911      PT SHORT TERM GOAL #1   Title  Patient will be adherent to HEP at least 3x a week to improve functional strength and balance for better safety at home.    Time  2    Period  Weeks    Status  New    Target Date  10/16/18        PT Long Term  Goals - 10/02/18 0911      PT LONG TERM GOAL #1   Title  Patient will increase LLE gross strength to 4+/5 as to improve functional strength for independent gait, increased standing tolerance and increased ADL ability.    Time  4    Period  Weeks    Status  New    Target Date  10/30/18      PT LONG TERM GOAL #2   Title  Patient will increase lower extremity functional scale to >60/80 to demonstrate improved functional mobility and increased tolerance with ADLs.    Time  4    Period  Weeks    Status  New    Target Date  10/30/18      PT LONG TERM GOAL #3   Title  Patient will tolerate 5 seconds of single leg stance without loss of balance to improve ability to get in and out of shower safely.    Time  4    Period  Weeks    Status  New    Target Date  10/30/18      PT LONG TERM GOAL #4   Title  Patient will increase six minute walk test distance to >1600 for progression of gait ability closer to age group norms    Time  4    Period  Weeks    Status  New    Target Date  10/30/18            Plan - 10/10/18 0909    Clinical Impression Statement  Pt demonstrates excellent motivation throughout physical therapy session.  He states that his biggest concern is the full body fatigue that he has been feeling since his stroke.  Pt also stated during the  session that he feels like he experienced some weakness, numbness, and tingling in his RUE and RLE last week, stating that his right hand "had a mind of its own".  Pt education about the BE FAST acronym, and advised that if he feels any unusual symptoms in the future to seek medical care promptly.  He states that his strength and sensation on the R have returned to normal at this time.  He demonstrates good postural control with airex balance beam tandem walking, only using SUE 2-3 times throughout for steadying.  Treadmill walking and jogging was also trialed today, with pt demonstrating good running technique with equal weight shift, hip flexion, step/stride length, and foot clearance throughout.  He does state that he has to consciously think about maintaining ankle dorsiflexion while jogging to achieve foot clearance.  HEP was reviewed and pt demonstrates good understanding and technique, requiring min verbal cueing to keep his knee straight on hip extension.  Seated L dorsiflexion with the green theraband was added to HEP today to increase L dorsiflexion strength and endurance.  Pt will benefit from PT services to address deficits in strength, balance, and mobility in order to return to full function at home and work.    Personal Factors and Comorbidities  Comorbidity 1    Comorbidities  HTN, controlled;    Examination-Activity Limitations  Stairs;Lift;Stand;Locomotion Level    Examination-Participation Restrictions  Cleaning;Community Activity;Driving;Shop;Volunteer;Yard Work    Stability/Clinical Decision Making  Stable/Uncomplicated    Rehab Potential  Good    PT Frequency  1x / week    PT Duration  4 weeks    PT Treatment/Interventions  Cryotherapy;Moist Heat;Gait training;Stair training;Functional mobility training;Therapeutic activities;Therapeutic exercise;Balance training;Neuromuscular re-education;Patient/family education    PT Next  Visit Plan  advance HEP with balance activities    PT Home  Exercise Plan  Initiated (standing hip abduction, hip extension, hip flexion, side stepping all with green tband); dorsiflexion was added today    Consulted and Agree with Plan of Care  Patient       Patient will benefit from skilled therapeutic intervention in order to improve the following deficits and impairments:  Impaired sensation, Decreased coordination, Decreased mobility, Decreased strength, Decreased endurance, Decreased activity tolerance, Decreased balance, Difficulty walking  Visit Diagnosis: Muscle weakness (generalized)  Unsteadiness on feet     Problem List Patient Active Problem List   Diagnosis Date Noted  . Heart murmur 02/21/2015  . Hypertension 02/21/2015  . Hyperlipidemia 02/21/2015    This entire session was performed under direct supervision and direction of a licensed therapist/therapist assistant . I have personally read, edited and approve of the note as written.   Lutricia Horsfall, SPT Phillips Grout PT, DPT, GCS  Huprich,Jason 10/10/2018, 10:29 AM  [place] MAIN [facility] SERVICES [address] [place], [place], [address] Phone: [phone]   Fax:  [phone]  Name: KAREEM CATHEY MRN: [id number] Date of Birth: 01/10/1971

## 2018-10-15 ENCOUNTER — Ambulatory Visit: Payer: BLUE CROSS/BLUE SHIELD

## 2018-10-15 ENCOUNTER — Other Ambulatory Visit: Payer: Self-pay

## 2018-10-15 DIAGNOSIS — M6281 Muscle weakness (generalized): Secondary | ICD-10-CM | POA: Diagnosis not present

## 2018-10-15 DIAGNOSIS — R2681 Unsteadiness on feet: Secondary | ICD-10-CM

## 2018-10-15 NOTE — Therapy (Signed)
[facility] [address] [place], [place], [address] Phone: [phone]   Fax:  [phone]  Physical Therapy Treatment  Patient Details  Name: Charles Alvarado MRN: [id number] Date of Birth: 10/18/1970 Referring Provider (PT): Maurice SmallEmily Boyce FNP   Encounter Date: 10/15/2018  PT End of Session - 10/15/18 1215    Visit Number  3    Number of Visits  5    Date for PT Re-Evaluation  10/30/18    PT Start Time  0847    PT Stop Time  0929    PT Time Calculation (min)  42 min    Equipment Utilized During Treatment  Gait belt    Activity Tolerance  Patient tolerated treatment well    Behavior During Therapy  [facility] for tasks assessed/performed       Past Medical History:  Diagnosis Date  . Anemia    H/O AS A CHILD  . Blood clots in brain   . Claustrophobia   . Heart murmur    ASYMPTOMATIC-RECENTLY DIAGNOSED 3 MONTHS  AGO(03-2015) BY PCP  . Hyperlipidemia   . Hypertension   . Stroke [facility])     Past Surgical History:  Procedure Laterality Date  . CLOSED REDUCTION SHOULDER DISLOCATION  06-2015  . SHOULDER ARTHROSCOPY WITH OPEN ROTATOR CUFF REPAIR AND DISTAL CLAVICLE ACROMINECTOMY Right 06/16/2015   Procedure: right shoulder arthroscopic repair of labral tear;  Surgeon: Juanell FairlyKevin Krasinski, MD;  Location: [facility] ORS;  Service: Orthopedics;  Laterality: Right;    There were no vitals filed for this visit.  Subjective Assessment - 10/15/18 0852    Subjective  Patient reports he has had some headaches that went away when taking advil. No new numbness/tingling. HEP compliant.    Pertinent History  48 yo Male s/p CVA with left sided weakness on 09/12/18; He reports most of his strength has returned but he is still having some numbness and tingling on left side of body particularly in LUE hand and LLE. He reports feeling very tired and weak, reporting that he has lost all his stamina. 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 He has driven some  but very limited. He denies any pain; He denies any recent falls. He presents to therapy without AD, walking independently;    Limitations  Standing;Walking    How long can you sit comfortably?  reports increased numbness with prolonged sitting;    How long can you stand comfortably?  Feels off balance in standing; limited to <30 min, has to hold onto things for balance;    How long can you walk comfortably?  about 500 feet;    Diagnostic tests  CT shows subacute right parieto-occipital infarct    Patient Stated Goals  "get back to normal."  PLOF included weight lifting, able to bench press 225#    Currently in Pain?  No/denies          Vitals at beginning of session: Blood pressure:  144/83 pulse 67   Treatment:  Neuro Re-ed: verbal cueing for sequencing of body mechanics and postural correction for optimal positioning, stability, and muscle recruitment.   bosu ball static stand: black side up. Static stand; cueing for weight shift onto LLE to equalize pressure though bilateral LE; 30 second holds x 2  bosu ball mini squats 10x, challenge with equal weight shift onto LLE, use of // bars to step onto/off of bosu  Bosu  lunges: cueing for forward pelvic motion rather thank trunk flexion 10x each LE: blue side up; no UE support, more challenging to LLE than RLE  Lateral bosu lunge: blue side up: cueing for weight shift for increased challenge to limb 10x each side   Single leg sit to stands: LLE, 10x 2 sets from lowering table height.   Seated alphabet trace LLE, challenging for positional awareness.   6" step hamstring stretch 30 seconds 2x each LE  Half foam roller, flat side up df/pf 60 seconds, finger tip support for postural correction.   Speed ladder 6x each ; verbal cueing and demonstration for each task: in hallway:  -two feet in one square forward: challenged with quick feet aspect, more  challenged leading with LLE -lateral two feet in one square:  Challenged leading with LLE, one episode of near LOB -lateral in/out: diagonal shape, challenging at first, increasing in velocity with repetition -jump two forward, jump back one: very challenged with forward and backwards hop resulting in need for rest breaks -skier: lateral hop outside ladder, no LOB, good single leg support  -sideline in/out: very challenging to patient with coordination requiring increased cueing with decreased velocity.    Patient's vitals monitored throughout session to ensure functional range maintenance.       Pt educated throughout session about proper posture and technique with exercises. Improved exercise technique, movement at target joints, use of target muscles after min to mod verbal, visual, tactile cues.                     PT Education - 10/15/18 1214    Education Details  excercise technique stability, awareness and education on stroke syndromes    Person(s) Educated  Patient    Methods  Explanation;Demonstration;Tactile cues;Verbal cues    Comprehension  Verbalized understanding;Returned demonstration;Verbal cues required;Tactile cues required       PT Short Term Goals - 10/02/18 0911      PT SHORT TERM GOAL #1   Title  Patient will be adherent to HEP at least 3x a week to improve functional strength and balance for better safety at home.    Time  2    Period  Weeks    Status  New    Target Date  10/16/18        PT Long Term Goals - 10/02/18 0911      PT LONG TERM GOAL #1   Title  Patient will increase LLE gross strength to 4+/5 as to improve functional strength for independent gait, increased standing tolerance and increased ADL ability.    Time  4    Period  Weeks    Status  New    Target Date  10/30/18      PT LONG TERM GOAL #2   Title  Patient will increase lower extremity functional scale to >60/80 to demonstrate improved functional mobility and  increased tolerance with ADLs.    Time  4    Period  Weeks    Status  New    Target Date  10/30/18      PT LONG TERM GOAL #3   Title  Patient will tolerate 5 seconds of single leg stance without loss of balance to improve ability to get in and out of shower safely.    Time  4    Period  Weeks    Status  New    Target Date  10/30/18      PT LONG TERM GOAL #  4   Title  Patient will increase six minute walk test distance to >1600 for progression of gait ability closer to age group norms    Time  4    Period  Weeks    Status  New    Target Date  10/30/18            Plan - 10/15/18 1226    Clinical Impression Statement  Patient presents to physical therapy with good motivation. Reports some headaches but no more incidences of weakness. Patient re-educated on BE FAST acronym. Patient is challenged by single leg stability, coordination, and prolonged muscle recruitment of LLE. Coordination with high velocity movement is challenging to patient with patient verbalizing enjoyment of challenge of speed ladder. Control of LLE with spatial awareness is functional but challenged as task is increased in speed. Carryover to functional ambulation noted with no noticeable gait deviations s/p interventions. Pt will benefit from PT services to address deficits in strength, balance, and mobility in order to return to full function at home and work.    Personal Factors and Comorbidities  Comorbidity 1    Comorbidities  HTN, controlled;    Examination-Activity Limitations  Stairs;Lift;Stand;Locomotion Level    Examination-Participation Restrictions  Cleaning;Community Activity;Driving;Shop;Volunteer;Yard Work    Stability/Clinical Decision Making  Stable/Uncomplicated    Rehab Potential  Good    PT Frequency  1x / week    PT Duration  4 weeks    PT Treatment/Interventions  Cryotherapy;Moist Heat;Gait training;Stair training;Functional mobility training;Therapeutic activities;Therapeutic exercise;Balance  training;Neuromuscular re-education;Patient/family education    PT Next Visit Plan  advance HEP with balance activities    PT Home Exercise Plan  Initiated (standing hip abduction, hip extension, hip flexion, side stepping all with green tband); dorsiflexion was added today    Consulted and Agree with Plan of Care  Patient       Patient will benefit from skilled therapeutic intervention in order to improve the following deficits and impairments:  Impaired sensation, Decreased coordination, Decreased mobility, Decreased strength, Decreased endurance, Decreased activity tolerance, Decreased balance, Difficulty walking  Visit Diagnosis: Muscle weakness (generalized)  Unsteadiness on feet     Problem List Patient Active Problem List   Diagnosis Date Noted  . Heart murmur 02/21/2015  . Hypertension 02/21/2015  . Hyperlipidemia 02/21/2015   Precious Bard, PT, DPT   10/15/2018, 12:32 PM  [facility] MAIN [facility] SERVICES [address] [place], [place], [address] Phone: [phone]   Fax:  [phone]  Name: Charles Alvarado MRN: [id number] Date of Birth: [birth year]

## 2018-10-20 ENCOUNTER — Ambulatory Visit: Payer: BLUE CROSS/BLUE SHIELD | Admitting: Physical Therapy

## 2018-10-20 ENCOUNTER — Other Ambulatory Visit: Payer: Self-pay

## 2018-10-20 ENCOUNTER — Encounter: Payer: Self-pay | Admitting: Physical Therapy

## 2018-10-20 VITALS — BP 143/92 | HR 65

## 2018-10-20 DIAGNOSIS — M6281 Muscle weakness (generalized): Secondary | ICD-10-CM | POA: Diagnosis not present

## 2018-10-20 DIAGNOSIS — R2681 Unsteadiness on feet: Secondary | ICD-10-CM

## 2018-10-20 NOTE — Patient Instructions (Signed)
Access Code: [email or web address]  URL: [email or web address]  Date: 10/20/2018  Prepared by: Blanche East   Exercises  Single Leg Jumps Side to Side - 10 reps - 2 sets - 1x daily - 7x weekly  Diagonal Forward Reaches - 10 reps - 2 sets - 1x daily - 7x weekly  Walking Lunge with Arms Overhead - 10 reps - 2 sets - 1x daily - 7x weekly  Single Leg Step Outs with Resistance - 5 reps - 2 sets - 1x daily - 7x weekly  Forward Monster Walks - 5 reps - 2 sets - 1x daily - 7x weekly

## 2018-10-20 NOTE — Therapy (Signed)
[place] MAIN [facility] SERVICES [address] [place], [place], [address] Phone: [phone]   Fax:  [phone]  Physical Therapy Treatment  Patient Details  Name: Charles Alvarado MRN: [id number] Date of Birth: 1970-12-28 Referring Provider (PT): Raelyn Ensign FNP   Encounter Date: 10/20/2018  PT End of Session - 10/20/18 1004    Visit Number  4    Number of Visits  5    Date for PT Re-Evaluation  10/30/18    PT Start Time  0935    PT Stop Time  1015    PT Time Calculation (min)  40 min    Equipment Utilized During Treatment  Gait belt    Activity Tolerance  Patient tolerated treatment well    Behavior During Therapy  [facility] for tasks assessed/performed       Past Medical History:  Diagnosis Date  . Anemia    H/O AS A CHILD  . Blood clots in brain   . Claustrophobia   . Heart murmur    ASYMPTOMATIC-RECENTLY DIAGNOSED 3 MONTHS  AGO(03-2015) BY PCP  . Hyperlipidemia   . Hypertension   . Stroke [facility])     Past Surgical History:  Procedure Laterality Date  . CLOSED REDUCTION SHOULDER DISLOCATION  06-2015  . SHOULDER ARTHROSCOPY WITH OPEN ROTATOR CUFF REPAIR AND DISTAL CLAVICLE ACROMINECTOMY Right 06/16/2015   Procedure: right shoulder arthroscopic repair of labral tear;  Surgeon: Thornton Park, MD;  Location: [facility] ORS;  Service: Orthopedics;  Laterality: Right;    Vitals:   10/20/18 0937  BP: (!) 143/92  Pulse: 65  SpO2: 100%    Subjective Assessment - 10/20/18 0936    Subjective  Patient reports he has been able to do less activities over time since beginning treatment; has gone walking around his neighborhood block twice so far, which takes about twenty minutes. Patient reports that his single leg balance has improved on BLE. HEP compliant.    Pertinent History  48 yo Male s/p CVA with left sided weakness on 09/12/18; He reports most of his strength has returned but he is still having some numbness and tingling on left side of  body particularly in LUE hand and LLE. He reports feeling very tired and weak, reporting that he has lost all his stamina. 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 He has driven some  but very limited. He denies any pain; He denies any recent falls. He presents to therapy without AD, walking independently;    Limitations  Standing;Walking    How long can you sit comfortably?  reports increased numbness with prolonged sitting;    How long can you stand comfortably?  Feels off balance in standing; limited to <30 min, has to hold onto things for balance;    How long can you walk comfortably?  about 500 feet;    Diagnostic tests  CT shows subacute right parieto-occipital infarct    Patient Stated Goals  "get back to normal."  PLOF included weight lifting, able to bench press 225#    Currently in Pain?  No/denies    Multiple Pain Sites  No       TREATMENT: Neuromuscular Re-education:  Patient walked 480 ft down hallway (3 total laps) with brisk pace.  Patient performed ball tosses against wall with single leg stance on purple foam, 10 reps BLE; CGA with gait belt.   Patient performed trunk rotations x5 each side while  holding ball in semi-tandem stance on purple foam, then ball raises overhead x5; right foot forward followed by left foot forward.   Exercises added to HEP and performed in session: Single Leg Jumps Side to Side - 10 reps - 2 sets - 1x daily - 7x weekly - cues to hold SLS for 3 seconds Diagonal Forward Reaches - 10 reps - 2 sets - 1x daily - 7x weekly - cues for core strengthening and contralateral leg stability for balance control, cues for proper sequencing and limiting trunk rotation  Walking Lunge with Arms Overhead - 10 reps - 2 sets - 1x daily - 7x weekly - cues for proper positioning, able to keep balance well keeping arms overhead Single Leg Step Outs with Resistance - 5 reps - 2 sets - 1x daily - 7x  weekly - required 1 hand hold assist and min verbal cues for core stabilization and weight shift for improved balance  Forward/Backward Monster Walks - 5 reps - 2 sets - 1x daily - 7x weekly - cues for weight shift     Response to Treatment: Patient responded well to therapy. Able to perform brisk 480 ft walk in hallway without rise in stroke-related fatigue. Demonstrated slight unsteadiness during therapeutic exercises requiring single leg stance, improved with repetition and cues for technique. Patient showed good propensity to perform new HEP exercises in clinic with understanding of proper technique. Mild fatigue reported at end of session.                  PT Education - 10/20/18 1338    Education Details  HEP/exercise/balance;    Person(s) Educated  Patient    Methods  Explanation;Verbal cues    Comprehension  Verbalized understanding;Returned demonstration;Verbal cues required;Need further instruction       PT Short Term Goals - 10/02/18 0911      PT SHORT TERM GOAL #1   Title  Patient will be adherent to HEP at least 3x a week to improve functional strength and balance for better safety at home.    Time  2    Period  Weeks    Status  New    Target Date  10/16/18        PT Long Term Goals - 10/02/18 0911      PT LONG TERM GOAL #1   Title  Patient will increase LLE gross strength to 4+/5 as to improve functional strength for independent gait, increased standing tolerance and increased ADL ability.    Time  4    Period  Weeks    Status  New    Target Date  10/30/18      PT LONG TERM GOAL #2   Title  Patient will increase lower extremity functional scale to >60/80 to demonstrate improved functional mobility and increased tolerance with ADLs.    Time  4    Period  Weeks    Status  New    Target Date  10/30/18      PT LONG TERM GOAL #3   Title  Patient will tolerate 5 seconds of single leg stance without loss of balance to improve ability to get in and  out of shower safely.    Time  4    Period  Weeks    Status  New    Target Date  10/30/18      PT LONG TERM GOAL #4   Title  Patient will increase six minute walk test distance to >1600 for  progression of gait ability closer to age group norms    Time  4    Period  Weeks    Status  New    Target Date  10/30/18            Plan - 10/20/18 1338    Clinical Impression Statement  Patient motivated and participated well within session. He denies any symptoms. Assessed vitals with good response. patient instructed in advanced HEP with balance exercise to challenge SLS and dynamic balance control. He required min VCs for proper positioning and weight shift for better stance control. Patient would benefit from additional skilled PT intervention to improve strength, balance and gait safety;    Personal Factors and Comorbidities  Comorbidity 1    Comorbidities  HTN, controlled;    Examination-Activity Limitations  Stairs;Lift;Stand;Locomotion Level    Examination-Participation Restrictions  Cleaning;Community Activity;Driving;Shop;Volunteer;Yard Work    Stability/Clinical Decision Making  Stable/Uncomplicated    Rehab Potential  Good    PT Frequency  1x / week    PT Duration  4 weeks    PT Treatment/Interventions  Cryotherapy;Moist Heat;Gait training;Stair training;Functional mobility training;Therapeutic activities;Therapeutic exercise;Balance training;Neuromuscular re-education;Patient/family education    PT Next Visit Plan  advance HEP with balance activities    PT Home Exercise Plan  Initiated (standing hip abduction, hip extension, hip flexion, side stepping all with green tband); dorsiflexion was added today    Consulted and Agree with Plan of Care  Patient       Patient will benefit from skilled therapeutic intervention in order to improve the following deficits and impairments:  Impaired sensation, Decreased coordination, Decreased mobility, Decreased strength, Decreased endurance,  Decreased activity tolerance, Decreased balance, Difficulty walking  Visit Diagnosis: Muscle weakness (generalized)  Unsteadiness on feet     Problem List Patient Active Problem List   Diagnosis Date Noted  . Heart murmur 02/21/2015  . Hypertension 02/21/2015  . Hyperlipidemia 02/21/2015    Charles Alvarado PT, DPT 10/20/2018, 1:40 PM  [place] [facility] [address] [place], [place], [address] Phone: [phone]   Fax:  [phone]  Name: Charles Alvarado MRN: [id number] Date of Birth: Dec 06, 1970

## 2018-10-22 ENCOUNTER — Ambulatory Visit: Payer: Self-pay | Admitting: Physical Therapy

## 2018-10-29 ENCOUNTER — Ambulatory Visit: Payer: BLUE CROSS/BLUE SHIELD | Admitting: Physical Therapy

## 2018-11-19 ENCOUNTER — Encounter: Payer: Self-pay | Admitting: Family Medicine

## 2018-12-09 ENCOUNTER — Encounter: Payer: Self-pay | Admitting: Family Medicine

## 2018-12-09 ENCOUNTER — Ambulatory Visit: Payer: PRIVATE HEALTH INSURANCE | Admitting: Family Medicine

## 2018-12-09 ENCOUNTER — Other Ambulatory Visit: Payer: Self-pay

## 2018-12-09 VITALS — BP 124/72 | HR 78 | Temp 97.9°F | Resp 18 | Ht 74.0 in | Wt 213.0 lb

## 2018-12-09 DIAGNOSIS — E782 Mixed hyperlipidemia: Secondary | ICD-10-CM

## 2018-12-09 DIAGNOSIS — N521 Erectile dysfunction due to diseases classified elsewhere: Secondary | ICD-10-CM

## 2018-12-09 DIAGNOSIS — Z8673 Personal history of transient ischemic attack (TIA), and cerebral infarction without residual deficits: Secondary | ICD-10-CM | POA: Diagnosis not present

## 2018-12-09 DIAGNOSIS — I10 Essential (primary) hypertension: Secondary | ICD-10-CM

## 2018-12-09 DIAGNOSIS — I69354 Hemiplegia and hemiparesis following cerebral infarction affecting left non-dominant side: Secondary | ICD-10-CM

## 2018-12-09 DIAGNOSIS — Z7901 Long term (current) use of anticoagulants: Secondary | ICD-10-CM

## 2018-12-09 DIAGNOSIS — J0100 Acute maxillary sinusitis, unspecified: Secondary | ICD-10-CM

## 2018-12-09 DIAGNOSIS — R7303 Prediabetes: Secondary | ICD-10-CM

## 2018-12-09 MED ORDER — CLOPIDOGREL BISULFATE 75 MG PO TABS: 75.0000 mg | ORAL_TABLET | Freq: Every day | ORAL | 0 refills | Status: DC

## 2018-12-09 MED ORDER — AMOXICILLIN-POT CLAVULANATE 875-125 MG PO TABS: 1.0000 | ORAL_TABLET | Freq: Two times a day (BID) | ORAL | 0 refills | Status: AC

## 2018-12-09 NOTE — Patient Instructions (Signed)
[email or web address] - pull up coupon on your phone for the pharmacy at [facility]

## 2018-12-09 NOTE — Progress Notes (Signed)
Name: Charles Alvarado   MRN: [id number]    DOB: 1970/07/12   Date:12/09/2018       Progress Note  Subjective  Chief Complaint  Chief Complaint  Patient presents with  . Hypertension    follow up  . Medication Problem    discuss blood thinner    HPI  HTN: BP is at goal today; taking 20mg  Lisinopril. Denies chest pain, shortness of breath, BLE edema.  Has been having headaches since discharge from the hospital.  Ischemic Stroke with LLE Weakness and LLE/LUE Numbness: Taking plavix 75mg  initial discharge recommendation was to stop plavix after 90 days - 12/12/2018, however there is a note from Dr. Sherri RadKrawchuk from 10/27/2018 that states to maintain plavix for the time being, so we will maintain.  Also taking atorvastatin 80mg , aspirin 81mg  daily. No signs or symptoms of bleeding.  He does note that he would like to switch neurology teams to [place] in [place].  - Has been having intermittent headaches - pain is 5/10 now (much better than initially), takes tylenol and takes a nap and this seems to take care of the pain; pain is posterior, pain is described as throbbing. He has not had any PT set up, but is willing to see them. - Fatigue has improved; going to the gym now, also this is his first full week back to work (works with [employer]). Confusion has resolved. - States feels like the left side of his body is about 95% improved in its strength. - Does note that his vision on his LEFT eye has been decreased, is planning to schedule appointment.  - ED - has been struggling with this since his stroke.  He states that this has been a frustrating aspect of his recovery, has a healthy relationship with his wife whom has been understanding, and he would like to try Viagra.  Discussed risk/benefit - BP is well controlled, and I do believe he can tolerate the medication without issue.  HLD: LDL was 188, taking atorvastatin now and tolerating without myalgias or concerns.   Due for labs to recheck today.   Prediabetes: A1C 5.8% in the hospital on 09/13/2018.  Discussed diet changes. Denies polyuria, polydipsia, or polyphagia. No changes; diet controlled at this time.  Sinusitis: Has had nasal congestion for about 2 weeks, has developed maxillary sinus pain over the last 3 days with some submandibular lymph enlargement.  Is concerned he may have sinusitis.  Patient Active Problem List   Diagnosis Date Noted  . Heart murmur 02/21/2015  . Hypertension 02/21/2015  . Hyperlipidemia 02/21/2015    Past Surgical History:  Procedure Laterality Date  . CLOSED REDUCTION SHOULDER DISLOCATION  06-2015  . SHOULDER ARTHROSCOPY WITH OPEN ROTATOR CUFF REPAIR AND DISTAL CLAVICLE ACROMINECTOMY Right 06/16/2015   Procedure: right shoulder arthroscopic repair of labral tear;  Surgeon: Juanell FairlyKevin Krasinski, MD;  Location: [facility] ORS;  Service: Orthopedics;  Laterality: Right;    Family History  Problem Relation Age of Onset  . Hypertension Mother   . Hypertension Father   . Diabetes Father   . Diabetes Sister   . Hypertension Sister     Social History   Socioeconomic History  . Marital status: Married    Spouse name: Marylene Landngela  . Number of children: 0  . Years of education: Not on file  . Highest education level: Not on file  Occupational History  . Occupation: [employer]  Social Needs  . Financial resource strain: Not hard at all  .  Food insecurity    Worry: Never true    Inability: Never true  . Transportation needs    Medical: No    Non-medical: No  Tobacco Use  . Smoking status: Never Smoker  . Smokeless tobacco: Never Used  Substance and Sexual Activity  . Alcohol use: No    Alcohol/week: 0.0 standard drinks  . Drug use: No  . Sexual activity: Yes    Partners: Female  Lifestyle  . Physical activity    Days per week: 0 days    Minutes per session: 0 min  . Stress: Only a little  Relationships  . Social connections    Talks on phone: More than three times  a week    Gets together: Never    Attends religious service: More than 4 times per year    Active member of club or organization: Yes    Attends meetings of clubs or organizations: 1 to 4 times per year    Relationship status: Married  . Intimate partner violence    Fear of current or ex partner: No    Emotionally abused: No    Physically abused: No    Forced sexual activity: No  Other Topics Concern  . Not on file  Social History Narrative  . Not on file     Current Outpatient Medications:  .  atorvastatin (LIPITOR) 80 MG tablet, Take by mouth., Disp: , Rfl:  .  clopidogrel (PLAVIX) 75 MG tablet, Take by mouth., Disp: , Rfl:  .  lisinopril (PRINIVIL,ZESTRIL) 20 MG tablet, Take 10 mg by mouth every morning. Taking 1/2 a tablet qd, Disp: , Rfl:   No Active Allergies  I personally reviewed active problem list, medication list, allergies, notes from last encounter, lab results with the patient/caregiver today.   ROS  Ten systems reviewed and is negative except as mentioned in HPI  Objective  Vitals:   12/09/18 0917  BP: 124/72  Pulse: 78  Resp: 18  Temp: 97.9 F (36.6 C)  TempSrc: Temporal  SpO2: 97%  Weight: 213 lb (96.6 kg)  Height: 6\' 2"  (1.88 m)    Body mass index is 27.35 kg/m.  Physical Exam Constitutional: Patient appears well-developed and well-nourished. No distress.  HENT: Head: Normocephalic and atraumatic. Ears: bilateral TMs with no erythema or effusion; Nose: Nose normal. There is maxillary sinus tenderness; no frontal sinus tenderness bilaterally Mouth/Throat: Oropharynx is clear and moist. No oropharyngeal exudate or tonsillar swelling.  Eyes: Conjunctivae and EOM are normal. No scleral icterus.  Pupils are equal, round, and reactive to light.  Neck: Normal range of motion. Neck supple, though there is mild bilateral submandibular lymphadenopathy present with mild tenderness. No JVD present. No thyromegaly present.  Cardiovascular: Normal rate,  regular rhythm and normal heart sounds.  No murmur heard. No BLE edema. Pulmonary/Chest: Effort normal and breath sounds normal. No respiratory distress. Abdominal: Soft. Bowel sounds are normal, no distension. There is no tenderness. No masses. Musculoskeletal: Normal range of motion, no joint effusions. No gross deformities Neurological: Pt is alert and oriented to person, place, and time. No cranial nerve deficit. Coordination, balance, speech and gait are normal; strength near normal with LEFT upper and lower extremity strength 4/5 and RIGHT side 5/5.  Skin: Skin is warm and dry. No rash noted. No erythema.  Psychiatric: Patient has a normal mood and affect. behavior is normal. Judgment and thought content normal.  No results found for this or any previous visit (from the past 72 hour(s)).  PHQ2/9: Depression screen [facility] 2/9 09/18/2018 04/13/2015 02/21/2015 02/21/2015  Decreased Interest 0 0 0 0  Down, Depressed, Hopeless 0 0 0 0  PHQ - 2 Score 0 0 0 0  Altered sleeping 1 - - -  Tired, decreased energy 1 - - -  Change in appetite 0 - - -  Feeling bad or failure about yourself  0 - - -  Trouble concentrating 1 - - -  Moving slowly or fidgety/restless 1 - - -  Suicidal thoughts 0 - - -  PHQ-9 Score 4 - - -  Difficult doing work/chores Somewhat difficult - - -   PHQ-2/9 Result is negative.    Fall Risk: Fall Risk  09/18/2018 04/13/2015 02/21/2015 02/21/2015  Falls in the past year? 0 No No No  Number falls in past yr: 0 - - -  Injury with Fall? 0 - - -  Follow up Falls evaluation completed - - -   Assessment & Plan  1. 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  Dispense: 90 tablet; Refill: 0 - CBC with Differential/Platelet - Lipid panel - sildenafil (VIAGRA) 100 MG tablet; Take 0.5-1 tablets (50-100 mg  total) by mouth daily as needed for erectile dysfunction.  Dispense: 30 tablet; Refill: 3  2. History of ischemic stroke - Ambulatory referral to Neurology - clopidogrel (PLAVIX) 75 MG tablet; Take 1 tablet (75 mg total) by mouth daily.  Dispense: 90 tablet; Refill: 0 - CBC with Differential/Platelet - Lipid panel - sildenafil (VIAGRA) 100 MG tablet; Take 0.5-1 tablets (50-100 mg total) by mouth daily as needed for erectile dysfunction.  Dispense: 30 tablet; Refill: 3  3. Mixed hyperlipidemia - clopidogrel (PLAVIX) 75 MG tablet; Take 1 tablet (75 mg total) by mouth daily.  Dispense: 90 tablet; Refill: 0 - CBC with Differential/Platelet - Lipid panel  4. Essential hypertension - COMPLETE METABOLIC PANEL WITH GFR - sildenafil (VIAGRA) 100 MG tablet; Take 0.5-1 tablets (50-100 mg total) by mouth daily as needed for erectile dysfunction.  Dispense: 30 tablet; Refill: 3  5. Prediabetes - COMPLETE METABOLIC PANEL WITH GFR - Hemoglobin A1c  6. Anticoagulated - CBC with Differential/Platelet  7. Acute non-recurrent maxillary sinusitis - amoxicillin-clavulanate (AUGMENTIN) 875-125 MG tablet; Take 1 tablet by mouth 2 (two) times daily for 10 days.  Dispense: 20 tablet; Refill: 0  8. Erectile dysfunction due to diseases classified elsewhere - Must alert any Emergency services if you have heart attack symptoms while taking this medication that you are prescribed this medication. - sildenafil (VIAGRA) 100 MG tablet; Take 0.5-1 tablets (50-100 mg total) by mouth daily as needed for erectile dysfunction.  Dispense: 30 tablet; Refill: 3

## 2018-12-10 ENCOUNTER — Other Ambulatory Visit: Payer: Self-pay | Admitting: Family Medicine

## 2018-12-10 DIAGNOSIS — Z8673 Personal history of transient ischemic attack (TIA), and cerebral infarction without residual deficits: Secondary | ICD-10-CM | POA: Insufficient documentation

## 2018-12-10 DIAGNOSIS — R7303 Prediabetes: Secondary | ICD-10-CM | POA: Insufficient documentation

## 2018-12-10 DIAGNOSIS — E782 Mixed hyperlipidemia: Secondary | ICD-10-CM

## 2018-12-10 DIAGNOSIS — I69354 Hemiplegia and hemiparesis following cerebral infarction affecting left non-dominant side: Secondary | ICD-10-CM

## 2018-12-10 DIAGNOSIS — Z7901 Long term (current) use of anticoagulants: Secondary | ICD-10-CM

## 2018-12-10 LAB — CBC WITH DIFFERENTIAL/PLATELET
Absolute Monocytes: 387 cells/uL (ref 200–950)
Basophils Absolute: 72 cells/uL (ref 0–200)
Basophils Relative: 1.6 %
Eosinophils Absolute: 122 cells/uL (ref 15–500)
Eosinophils Relative: 2.7 %
HCT: 39.3 % (ref 38.5–50.0)
Hemoglobin: 13.5 g/dL (ref 13.2–17.1)
Lymphs Abs: 1638 cells/uL (ref 850–3900)
MCH: 27.9 pg (ref 27.0–33.0)
MCHC: 34.4 g/dL (ref 32.0–36.0)
MCV: 81.2 fL (ref 80.0–100.0)
MPV: 10 fL (ref 7.5–12.5)
Monocytes Relative: 8.6 %
Neutro Abs: 2282 cells/uL (ref 1500–7800)
Neutrophils Relative %: 50.7 %
Platelets: 221 10*3/uL (ref 140–400)
RBC: 4.84 10*6/uL (ref 4.20–5.80)
RDW: 13.3 % (ref 11.0–15.0)
Total Lymphocyte: 36.4 %
WBC: 4.5 10*3/uL (ref 3.8–10.8)

## 2018-12-10 LAB — HEMOGLOBIN A1C
Hgb A1c MFr Bld: 5.5 % of total Hgb (ref <5.7)
Mean Plasma Glucose: 111 (calc)
eAG (mmol/L): 6.2 (calc)

## 2018-12-10 LAB — COMPLETE METABOLIC PANEL WITH GFR
AG Ratio: 1.6 (calc) (ref 1.0–2.5)
ALT: 31 U/L (ref 9–46)
AST: 32 U/L (ref 10–40)
Albumin: 4.6 g/dL (ref 3.6–5.1)
Alkaline phosphatase (APISO): 103 U/L (ref 36–130)
BUN: 24 mg/dL (ref 7–25)
CO2: 25 mmol/L (ref 20–32)
Calcium: 9.9 mg/dL (ref 8.6–10.3)
Chloride: 107 mmol/L (ref 98–110)
Creat: 1.24 mg/dL (ref 0.60–1.35)
GFR, Est African American: 79 mL/min/{1.73_m2} (ref >=60)
GFR, Est Non African American: 68 mL/min/{1.73_m2} (ref >=60)
Globulin: 2.8 g/dL (calc) (ref 1.9–3.7)
Glucose, Bld: 95 mg/dL (ref 65–99)
Potassium: 4.5 mmol/L (ref 3.5–5.3)
Sodium: 141 mmol/L (ref 135–146)
Total Bilirubin: 0.6 mg/dL (ref 0.2–1.2)
Total Protein: 7.4 g/dL (ref 6.1–8.1)

## 2018-12-10 LAB — LIPID PANEL
Cholesterol: 150 mg/dL (ref <200)
HDL: 36 mg/dL — ABNORMAL LOW (ref >=40)
LDL Cholesterol (Calc): 93 mg/dL (calc)
Non-HDL Cholesterol (Calc): 114 mg/dL (calc) (ref <130)
Total CHOL/HDL Ratio: 4.2 (calc) (ref <5.0)
Triglycerides: 112 mg/dL (ref <150)

## 2018-12-10 MED ORDER — SILDENAFIL CITRATE 100 MG PO TABS: 50.0000 mg | ORAL_TABLET | Freq: Every day | ORAL | 3 refills | Status: DC | PRN

## 2018-12-10 MED ORDER — EZETIMIBE 10 MG PO TABS: 10.0000 mg | ORAL_TABLET | Freq: Every day | ORAL | 3 refills | Status: DC

## 2018-12-11 ENCOUNTER — Encounter: Payer: Self-pay | Admitting: Family Medicine

## 2018-12-29 ENCOUNTER — Encounter: Payer: Self-pay | Admitting: Family Medicine

## 2018-12-29 DIAGNOSIS — I10 Essential (primary) hypertension: Secondary | ICD-10-CM

## 2018-12-30 MED ORDER — LISINOPRIL 10 MG PO TABS: 10.0000 mg | ORAL_TABLET | Freq: Every day | ORAL | 3 refills | Status: DC

## 2019-02-10 ENCOUNTER — Other Ambulatory Visit: Payer: BLUE CROSS/BLUE SHIELD

## 2019-02-24 ENCOUNTER — Ambulatory Visit (INDEPENDENT_AMBULATORY_CARE_PROVIDER_SITE_OTHER): Payer: BC Managed Care – PPO | Admitting: Family Medicine

## 2019-02-24 ENCOUNTER — Encounter: Payer: Self-pay | Admitting: Family Medicine

## 2019-02-24 ENCOUNTER — Other Ambulatory Visit: Payer: Self-pay

## 2019-02-24 DIAGNOSIS — Z8673 Personal history of transient ischemic attack (TIA), and cerebral infarction without residual deficits: Secondary | ICD-10-CM

## 2019-02-24 DIAGNOSIS — I69354 Hemiplegia and hemiparesis following cerebral infarction affecting left non-dominant side: Secondary | ICD-10-CM

## 2019-02-24 DIAGNOSIS — R7303 Prediabetes: Secondary | ICD-10-CM

## 2019-02-24 DIAGNOSIS — I10 Essential (primary) hypertension: Secondary | ICD-10-CM | POA: Diagnosis not present

## 2019-02-24 DIAGNOSIS — Z7901 Long term (current) use of anticoagulants: Secondary | ICD-10-CM

## 2019-02-24 DIAGNOSIS — E782 Mixed hyperlipidemia: Secondary | ICD-10-CM

## 2019-02-24 DIAGNOSIS — N521 Erectile dysfunction due to diseases classified elsewhere: Secondary | ICD-10-CM

## 2019-02-24 DIAGNOSIS — R011 Cardiac murmur, unspecified: Secondary | ICD-10-CM

## 2019-02-24 MED ORDER — ATORVASTATIN CALCIUM 40 MG PO TABS: 40.0000 mg | ORAL_TABLET | Freq: Every day | ORAL | 1 refills | Status: DC

## 2019-02-24 MED ORDER — TADALAFIL 20 MG PO TABS: 10.0000 mg | ORAL_TABLET | ORAL | 3 refills | Status: DC | PRN

## 2019-02-24 MED ORDER — LISINOPRIL 20 MG PO TABS: 20.0000 mg | ORAL_TABLET | Freq: Every day | ORAL | 3 refills | Status: DC

## 2019-02-24 NOTE — Progress Notes (Signed)
Name: Charles Alvarado   MRN: [id number]    DOB: 03-31-1970   Date:02/24/2019       Progress Note  Subjective  Chief Complaint  Chief Complaint  Patient presents with  . Follow-up    stroke  . Hypertension    was taking Lisinoprol 20mg  but 10mg  was called in   . COVID Positive    was recently cleared    I connected with  Rosaria Ferries on 02/24/19 at  8:20 AM EST by telephone and verified that I am speaking with the correct person using two identifiers.  I discussed the limitations, risks, security and privacy concerns of performing an evaluation and management service by telephone and the availability of in person appointments. Staff also discussed with the patient that there may be a patient responsible charge related to this service. Patient Location: Home Provider Location: Office Additional Individuals present: None  HPI  HTN: BP is at goal today at home; taking 20mg  Lisinopril. Denies chest pain, shortness of breath, BLE edema.   Ischemic Stroke with LLE Weakness and LLE: Taking plavix 75mg  initially upon discharge recommendation was to stop plavix after 90 days - 12/12/2018, however there is a note from Dr. Francis Dowse from 10/27/2018 that states to maintain plavix for the time being, so we maintained.  Has follow up later this month and will try to obtain clarification at that visit.  Also taking atorvastatin 80mg , aspirin 81mg  daily. No signs or symptoms of bleeding. -Headaches have resolved. - Did some PT, but it did not help him much.  He would rather go to the gym.  - Fatigue has returned since he has not been to the gym in the last few weeks.  - States feels like the left side of his body is about 95% improved in its strength. - Does note that his vision on his LEFT eye has been decreased, is planning to schedule appointment with eye doctor.  - ED - Has been struggling with this since his stroke.  He states that this has been a frustrating aspect of his  recovery, has a healthy relationship with his wife whom has been understanding.  Tried Viagra without success - achieved partial erection only.  Will switch to cialis, but if this does not work, we will refer to urology for further evaluation.   HLD: LDL was 93 (down from 188 when he had his stroke), taking atorvastatin and zetia.  Notes some joint pain that resolves throughout the day since starting atorvastatin, would like to decrease dose - we will decrease to 40mg  today and recheck in 3 months.  Prediabetes: A1C 5.8% in the hospital on 09/13/2018. Discussed diet changes. Denies polyuria, polydipsia, or polyphagia. No changes, diet controlled and stable.  Will recheck at next visit.   Patient Active Problem List   Diagnosis Date Noted  . 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 History of ischemic stroke 12/10/2018  . Prediabetes 12/10/2018  . Anticoagulated 12/10/2018  . Heart murmur 02/21/2015  . Hypertension 02/21/2015  . Hyperlipidemia 02/21/2015    Past Surgical History:  Procedure Laterality Date  . CLOSED REDUCTION SHOULDER DISLOCATION  06-2015  . SHOULDER ARTHROSCOPY WITH OPEN ROTATOR CUFF REPAIR AND DISTAL CLAVICLE ACROMINECTOMY Right 06/16/2015   Procedure: right shoulder arthroscopic repair of labral tear;  Surgeon: Thornton Park, MD;  Location: [facility] ORS;  Service: Orthopedics;  Laterality: Right;    Family History  Problem Relation Age of Onset  .  Hypertension Mother   . Hypertension Father   . Diabetes Father   . Diabetes Sister   . Hypertension Sister     Social History   Socioeconomic History  . Marital status: Married    Spouse name: Marylene Land  . Number of children: 0  . Years of education: Not on file  . Highest education level: Not on file  Occupational History  . Occupation: [employer]  Tobacco Use  . Smoking status: Never Smoker  . Smokeless tobacco: Never Used  Substance and Sexual Activity  .  Alcohol use: No    Alcohol/week: 0.0 standard drinks  . Drug use: No  . Sexual activity: Yes    Partners: Female  Other Topics Concern  . Not on file  Social History Narrative  . Not on file   Social Determinants of Health   Financial Resource Strain: Low Risk   . Difficulty of Paying Living Expenses: Not hard at all  Food Insecurity: No Food Insecurity  . Worried About Programme researcher, broadcasting/film/video in the Last Year: Never true  . Ran Out of Food in the Last Year: Never true  Transportation Needs: No Transportation Needs  . Lack of Transportation (Medical): No  . Lack of Transportation (Non-Medical): No  Physical Activity: Inactive  . Days of Exercise per Week: 0 days  . Minutes of Exercise per Session: 0 min  Stress: No Stress Concern Present  . Feeling of Stress : Only a little  Social Connections: Not Isolated  . Frequency of Communication with Friends and Family: More than three times a week  . Frequency of Social Gatherings with Friends and Family: Never  . Attends Religious Services: More than 4 times per year  . Active Member of Clubs or Organizations: Yes  . Attends Banker Meetings: 1 to 4 times per year  . Marital Status: Married  Catering manager Violence: Not At Risk  . Fear of Current or Ex-Partner: No  . Emotionally Abused: No  . Physically Abused: No  . Sexually Abused: No     Current Outpatient Medications:  .  aspirin 81 MG chewable tablet, Chew by mouth., Disp: , Rfl:  .  atorvastatin (LIPITOR) 80 MG tablet, Take by mouth., Disp: , Rfl:  .  clopidogrel (PLAVIX) 75 MG tablet, Take 1 tablet (75 mg total) by mouth daily., Disp: 90 tablet, Rfl: 0 .  ezetimibe (ZETIA) 10 MG tablet, Take 1 tablet (10 mg total) by mouth daily., Disp: 90 tablet, Rfl: 3 .  lisinopril (ZESTRIL) 10 MG tablet, Take 1 tablet (10 mg total) by mouth daily., Disp: 90 tablet, Rfl: 3 .  oxyCODONE-acetaminophen (PERCOCET) 10-325 MG tablet, TK 1 T PO 5 TIMES A DAY, Disp: , Rfl:  .   sildenafil (VIAGRA) 100 MG tablet, Take 0.5-1 tablets (50-100 mg total) by mouth daily as needed for erectile dysfunction., Disp: 30 tablet, Rfl: 3  No Known Allergies  I personally reviewed active problem list, medication list, allergies, notes from last encounter, lab results with the patient/caregiver today.  ROS  Constitutional: Negative for fever or weight change.  Respiratory: Negative for cough and shortness of breath.   Cardiovascular: Negative for chest pain or palpitations.  Gastrointestinal: Negative for abdominal pain, no bowel changes.  Musculoskeletal: Negative for gait problem or joint swelling.  Skin: Negative for rash.  Neurological: Negative for dizziness or headache.  No other specific complaints in a complete review of systems (except as listed in HPI above).  Objective  Virtual encounter,  vitals not obtained.  There is no height or weight on file to calculate BMI.  Physical Exam  Pulmonary/Chest: Effort normal. No respiratory distress. Speaking in complete sentences Neurological: Pt is alert and oriented to person, place, and time. Speech is normal. Psychiatric: Patient has a normal mood and affect. behavior is normal. Judgment and thought content normal.  No results found for this or any previous visit (from the past 72 hour(s)).  PHQ2/9: Depression screen [facility] 2/9 02/24/2019 12/09/2018 09/18/2018 04/13/2015 02/21/2015  Decreased Interest 0 0 0 0 0  Down, Depressed, Hopeless 0 0 0 0 0  PHQ - 2 Score 0 0 0 0 0  Altered sleeping 0 0 1 - -  Tired, decreased energy 0 0 1 - -  Change in appetite 0 0 0 - -  Feeling bad or failure about yourself  0 0 0 - -  Trouble concentrating 0 0 1 - -  Moving slowly or fidgety/restless 0 0 1 - -  Suicidal thoughts 0 0 0 - -  PHQ-9 Score 0 0 4 - -  Difficult doing work/chores Not difficult at all Not difficult at all Somewhat difficult - -   PHQ-2/9 Result is negative.    Fall Risk: Fall Risk  02/24/2019 12/09/2018 09/18/2018  04/13/2015 02/21/2015  Falls in the past year? 0 - 0 No No  Number falls in past yr: 0 0 0 - -  Injury with Fall? 0 0 0 - -  Follow up Falls evaluation completed Falls evaluation completed Falls evaluation completed - -    Assessment & Plan  1. Essential hypertension - Stable on 20mg  dosing.   - lisinopril (ZESTRIL) 20 MG tablet; Take 1 tablet (20 mg total) by mouth daily.  Dispense: 90 tablet; Refill: 3  2. 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  3. Mixed hyperlipidemia - He notes some minor joint pain since starting atorvastatin, will decrease dose to 40mg , if not improving in 4 weeks, we will have him come in for labs. - atorvastatin (LIPITOR) 40 MG tablet; Take 1 tablet (40 mg total) by mouth daily.  Dispense: 90 tablet; Refill: 1  4. History of ischemic stroke - Doing well overall, strength returning, headaches resolving.  Follow up with neuro later this month  5. Prediabetes - Diet controlled.   6. Anticoagulated - Taking plavix - will speak with neurology at his upcoming appt about plan to come off.  7. Heart murmur - No chest pain or shortness of breath, stable per his report.  8. Erectile dysfunction due to diseases classified elsewhere - tadalafil (CIALIS) 20 MG tablet; Take 0.5-1 tablets (10-20 mg total) by mouth every other day as needed for erectile dysfunction.  Dispense: 10 tablet; Refill: 3 - Switch to cialis, if not improving, will refer to urology for further evaluation as this is likely secondary to his stroke and may require additional intervention.  I discussed the assessment and treatment plan with the patient. The patient was provided an opportunity to ask questions and all were answered. The patient agreed with the plan and demonstrated an understanding of the instructions.   The patient was advised to call back  or seek an in-person evaluation if the symptoms worsen or if the condition fails to improve as anticipated.  I provided 21 minutes of non-face-to-face time during this encounter.  [facility], FNP

## 2019-02-24 NOTE — Patient Instructions (Signed)
Call back if joint pain is not improving after 4-6 weeks on lower dose of atorvastatin.

## 2019-03-07 ENCOUNTER — Other Ambulatory Visit: Payer: Self-pay | Admitting: Family Medicine

## 2019-03-07 DIAGNOSIS — I69354 Hemiplegia and hemiparesis following cerebral infarction affecting left non-dominant side: Secondary | ICD-10-CM

## 2019-03-07 DIAGNOSIS — E782 Mixed hyperlipidemia: Secondary | ICD-10-CM

## 2019-03-07 DIAGNOSIS — Z8673 Personal history of transient ischemic attack (TIA), and cerebral infarction without residual deficits: Secondary | ICD-10-CM

## 2019-03-30 ENCOUNTER — Encounter: Payer: Self-pay | Admitting: Family Medicine

## 2019-06-07 ENCOUNTER — Other Ambulatory Visit: Payer: Self-pay | Admitting: Family Medicine

## 2019-06-07 DIAGNOSIS — E782 Mixed hyperlipidemia: Secondary | ICD-10-CM

## 2019-06-07 DIAGNOSIS — I69354 Hemiplegia and hemiparesis following cerebral infarction affecting left non-dominant side: Secondary | ICD-10-CM

## 2019-06-07 DIAGNOSIS — Z8673 Personal history of transient ischemic attack (TIA), and cerebral infarction without residual deficits: Secondary | ICD-10-CM

## 2019-06-07 NOTE — Telephone Encounter (Signed)
Requested Prescriptions  Pending Prescriptions Disp Refills  . clopidogrel (PLAVIX) 75 MG tablet [Pharmacy Med Name: CLOPIDOGREL 75MG  TABLETS] 90 tablet 0    Sig: TAKE 1 TABLET(75 MG) BY MOUTH DAILY     Hematology: Antiplatelets - clopidogrel Failed - 06/07/2019  3:29 AM      Failed - Evaluate AST, ALT within 2 months of therapy initiation.      Passed - ALT in normal range and within 360 days    ALT  Date Value Ref Range Status  12/09/2018 31 9 - 46 U/L Final         Passed - AST in normal range and within 360 days    AST  Date Value Ref Range Status  12/09/2018 32 10 - 40 U/L Final         Passed - HCT in normal range and within 180 days    HCT  Date Value Ref Range Status  12/09/2018 39.3 38.5 - 50.0 % Final         Passed - HGB in normal range and within 180 days    Hemoglobin  Date Value Ref Range Status  12/09/2018 13.5 13.2 - 17.1 g/dL Final         Passed - PLT in normal range and within 180 days    Platelets  Date Value Ref Range Status  12/09/2018 221 140 - 400 Thousand/uL Final         Passed - Valid encounter within last 6 months    Recent Outpatient Visits          3 months ago Essential hypertension   [facility] [place], FNP   6 months ago Hemiplegia of left 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, unspecified hemiplegia type [facility])   [facility] [place], FNP   8 months ago 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

## 2019-07-07 ENCOUNTER — Ambulatory Visit: Payer: BC Managed Care – PPO | Admitting: Family Medicine

## 2019-07-16 ENCOUNTER — Other Ambulatory Visit: Payer: Self-pay

## 2019-07-16 ENCOUNTER — Encounter: Payer: Self-pay | Admitting: Family Medicine

## 2019-07-16 ENCOUNTER — Ambulatory Visit: Payer: BC Managed Care – PPO | Admitting: Family Medicine

## 2019-07-16 ENCOUNTER — Other Ambulatory Visit: Payer: Self-pay | Admitting: Family Medicine

## 2019-07-16 VITALS — BP 140/82 | HR 90 | Temp 98.1°F | Resp 14 | Ht 74.0 in | Wt 211.3 lb

## 2019-07-16 DIAGNOSIS — R7303 Prediabetes: Secondary | ICD-10-CM

## 2019-07-16 DIAGNOSIS — I69354 Hemiplegia and hemiparesis following cerebral infarction affecting left non-dominant side: Secondary | ICD-10-CM | POA: Diagnosis not present

## 2019-07-16 DIAGNOSIS — E782 Mixed hyperlipidemia: Secondary | ICD-10-CM | POA: Diagnosis not present

## 2019-07-16 DIAGNOSIS — Z5181 Encounter for therapeutic drug level monitoring: Secondary | ICD-10-CM

## 2019-07-16 DIAGNOSIS — I10 Essential (primary) hypertension: Secondary | ICD-10-CM

## 2019-07-16 DIAGNOSIS — H538 Other visual disturbances: Secondary | ICD-10-CM

## 2019-07-16 DIAGNOSIS — Z8673 Personal history of transient ischemic attack (TIA), and cerebral infarction without residual deficits: Secondary | ICD-10-CM | POA: Diagnosis not present

## 2019-07-16 DIAGNOSIS — Z7902 Long term (current) use of antithrombotics/antiplatelets: Secondary | ICD-10-CM

## 2019-07-16 DIAGNOSIS — G4700 Insomnia, unspecified: Secondary | ICD-10-CM

## 2019-07-16 DIAGNOSIS — R011 Cardiac murmur, unspecified: Secondary | ICD-10-CM

## 2019-07-16 MED ORDER — ATORVASTATIN CALCIUM 40 MG PO TABS: 40.0000 mg | ORAL_TABLET | Freq: Every day | ORAL | 3 refills | Status: DC

## 2019-07-16 MED ORDER — EZETIMIBE 10 MG PO TABS: 10.0000 mg | ORAL_TABLET | Freq: Every day | ORAL | 3 refills | Status: DC

## 2019-07-16 MED ORDER — CLOPIDOGREL BISULFATE 75 MG PO TABS: ORAL_TABLET | ORAL | 3 refills | Status: DC

## 2019-07-16 NOTE — Progress Notes (Signed)
Labs cancelled by epic when ordering [facility] lab options for pt who could not do labs today due to having to run to work Reorder of routine labs related to OV today, per quest - pt will return to clinic to do labs

## 2019-07-16 NOTE — Progress Notes (Signed)
Name: Charles Alvarado   MRN: [id number]    DOB: 1970-07-09   Date:07/16/2019       Progress Note  Chief Complaint  Patient presents with  . Follow-up  . Hypertension    pt states getting high readings at home  . Hyperlipidemia  . Insomnia    states only sleeping 4hrs per night     Subjective:   Charles Alvarado is a 49 y.o. male, presents to clinic for routine follow up on the conditions listed above.  Pt presents for routine f/up, has uncontrolled BP with high readings at home, he is also concerned about insomnia and stress at work. PT new to me, PCP moved out of area  HLD - he is compliant with zetia and lipitor 40 mg daily (care everywhere states 80 mg) - takes all med in the am Last lipids were well controlled He had stroke less than a year ago and reports left carotid stenosis or atherosclerosis - he was supposed to f/up with neuro but he says he never saw him.  He does not have any lasting residual effects of stroke.  But he feels that since then his vision is blurry.  He also reports no right carotid artery?  Blockage on the left?  He has not seen vascular - is scared of any procedures due to a friend having vascular procedure (stent he believes) and ended up loosing hearing.  He is on plavix - no nose bleeds, petechia, spontaneous bruising. Good compliance Blood streaks on toilet paper a few times, no melena/hematochezia  HTN - elevated BP at home 140-150/90's Hypertension:  He thinks elevated due to work stress, reviewed his flowsheet - BP around 140/80-90s since last august BP Readings from Last 3 Encounters:  07/16/19 140/82  12/09/18 124/72  10/20/18 (!) 143/92   Pt denies CP, SOB, exertional sx, LE edema, palpitation, Ha's, visual disturbances Dietary efforts for BP?  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 History of ischemic stroke 12/10/2018  . Prediabetes 12/10/2018  . Heart murmur 02/21/2015  . Hypertension 02/21/2015  . Hyperlipidemia 02/21/2015    Past Surgical History:  Procedure Laterality Date  . CLOSED REDUCTION SHOULDER DISLOCATION  06-2015  . SHOULDER ARTHROSCOPY WITH OPEN ROTATOR CUFF REPAIR AND DISTAL CLAVICLE ACROMINECTOMY Right 06/16/2015   Procedure: right shoulder arthroscopic repair of labral tear;  Surgeon: Thornton Park, MD;  Location: [facility] ORS;  Service: Orthopedics;  Laterality: Right;    Family History  Problem Relation Age of Onset  . Hypertension Mother   . Hypertension Father   . Diabetes Father   . Diabetes Sister   . Hypertension Sister     Social History   Tobacco Use  . Smoking status: Never Smoker  . Smokeless tobacco: Never Used  Vaping Use  . Vaping Use: Never used  Substance Use Topics  . Alcohol use: No    Alcohol/week: 0.0 standard drinks  . Drug use: No      Current Outpatient Medications:  .  aspirin 81 MG chewable tablet, Chew by mouth., Disp: , Rfl:  .  atorvastatin (LIPITOR) 40 MG tablet, Take 1 tablet (40 mg total) by mouth daily., Disp: 90 tablet, Rfl: 1 .  clopidogrel (PLAVIX) 75 MG tablet, TAKE 1 TABLET(75 MG) BY MOUTH DAILY, Disp: 90 tablet, Rfl: 0 .  ezetimibe (ZETIA) 10 MG tablet, Take 1 tablet (10 mg total) by mouth daily., Disp: 90 tablet, Rfl: 3 .  lisinopril (ZESTRIL) 20 MG tablet, Take 1 tablet (20 mg total) by mouth daily., Disp: 90 tablet, Rfl: 3 .  oxyCODONE-acetaminophen (PERCOCET) 10-325 MG tablet, TK 1 T PO 5 TIMES A DAY, Disp: , Rfl:  .  tadalafil (CIALIS) 20 MG tablet, Take 0.5-1 tablets (10-20 mg total) by mouth every other day as needed for erectile dysfunction., Disp: 10 tablet, Rfl: 3  No Known  Allergies  Chart Review Today:. I personally reviewed active problem list, medication list, allergies, family history, social history, health maintenance, notes from last encounter, lab results, imaging with the patient/caregiver today.   Review of Systems  10 Systems reviewed and are negative for acute change except as noted in the HPI.  Objective:    Vitals:   07/16/19 0949  BP: 140/82  Pulse: 90  Resp: 14  Temp: 98.1 F (36.7 C)  SpO2: 98%  Weight: 211 lb 4.8 oz (95.8 kg)  Height: 6\' 2"  (1.88 m)    Body mass index is 27.13 kg/m.  Physical Exam Vitals and nursing note reviewed.  Constitutional:      General: He is not in acute distress.    Appearance: Normal appearance. He is well-developed. He is not ill-appearing, toxic-appearing or diaphoretic.     Interventions: Face mask in place.  HENT:     Head: Normocephalic and atraumatic.     Jaw: No trismus.     Right Ear: External ear normal.     Left Ear: External ear normal.  Eyes:     General: Lids are normal. No scleral icterus.    Conjunctiva/sclera: Conjunctivae normal.     Pupils: Pupils are equal, round, and reactive to light.  Neck:     Vascular: No carotid bruit.     Trachea: Trachea and phonation normal. No tracheal deviation.  Cardiovascular:     Rate and Rhythm: Normal rate and regular rhythm.     Pulses: Normal pulses.          Radial pulses are 2+ on the right side and 2+ on the left side.       Posterior tibial pulses are 2+ on the right side and 2+ on the left side.     Heart sounds: Normal heart sounds. No murmur heard.  No friction rub. No gallop.   Pulmonary:     Effort: Pulmonary effort is normal. No respiratory distress.     Breath sounds: Normal breath sounds. No stridor. No wheezing, rhonchi or rales.  Abdominal:     General: Bowel sounds are normal. There is no distension.     Palpations: Abdomen is soft.     Tenderness: There is no abdominal tenderness. There is no guarding or rebound.   Musculoskeletal:        General: Normal range of motion.     Cervical back: Normal range of motion and neck supple.     Right lower leg: No edema.     Left lower leg: No edema.  Skin:    General: Skin is warm and dry.     Capillary Refill: Capillary refill takes less than 2 seconds.     Coloration: Skin is  not jaundiced.     Findings: No rash.     Nails: There is no clubbing.  Neurological:     Mental Status: He is alert.     Cranial Nerves: No dysarthria or facial asymmetry.     Motor: No tremor or abnormal muscle tone.     Gait: Gait normal.  Psychiatric:        Mood and Affect: Mood normal.        Speech: Speech normal.        Behavior: Behavior normal. Behavior is cooperative.        PHQ2/9: Depression screen [facility] 2/9 07/16/2019 02/24/2019 12/09/2018 09/18/2018 04/13/2015  Decreased Interest 0 0 0 0 0  Down, Depressed, Hopeless 0 0 0 0 0  PHQ - 2 Score 0 0 0 0 0  Altered sleeping 3 0 0 1 -  Tired, decreased energy 0 0 0 1 -  Change in appetite 0 0 0 0 -  Feeling bad or failure about yourself  0 0 0 0 -  Trouble concentrating 0 0 0 1 -  Moving slowly or fidgety/restless 0 0 0 1 -  Suicidal thoughts 0 0 0 0 -  PHQ-9 Score 3 0 0 4 -  Difficult doing work/chores Not difficult at all Not difficult at all Not difficult at all Somewhat difficult -    phq 9 is neg - noted poor sleep - addressing, otherwise neg and reviewed today  Fall Risk: Fall Risk  07/16/2019 02/24/2019 12/09/2018 09/18/2018 04/13/2015  Falls in the past year? 0 0 - 0 No  Number falls in past yr: 0 0 0 0 -  Injury with Fall? 0 0 0 0 -  Follow up - Falls evaluation completed Falls evaluation completed Falls evaluation completed -    Functional Status Survey: Is the patient deaf or have difficulty hearing?: No Does the patient have difficulty seeing, even when wearing glasses/contacts?: Yes Does the patient have difficulty concentrating, remembering, or making decisions?: No Does the patient have difficulty  walking or climbing stairs?: No Does the patient have difficulty dressing or bathing?: No Does the patient have difficulty doing errands alone such as visiting a doctor's office or shopping?: No   Assessment & Plan:   1. Essential hypertension Not well controlled, BP appears to be elevated - above goal, reviewed VS - poor control for nearly the past year. He is compliant with lisinopril 20 mg daily - discussed adding HCTZ or increasing lisinopril dose - he wanted to just leave it alone and wait and see if it will improve with lifestyle/diet Explained risk of uncontrolled HTN with his recent stoke  - he refuses to adjust meds Given handouts and info  Encouraged to come back if BP high or call if willing to change meds/add med - COMPLETE METABOLIC PANEL WITH GFR - Comprehensive metabolic panel  Pt agreed to increase lisinopril dose to 30 mg daily and monitor BP  2. Mixed hyperlipidemia Compliant with meds, no SE, no myalgias, fatigue or jaundice Due for FLP and recheck CMP Diet and exercise recommendations for HLD reviewed - ezetimibe (ZETIA) 10 MG tablet; Take 1 tablet (10 mg total) by mouth daily.  Dispense: 90 tablet; Refill: 3 - atorvastatin (LIPITOR) 40 MG tablet; Take 1 tablet (40 mg total) by mouth daily.  Dispense: 90 tablet; Refill: 3 - clopidogrel (PLAVIX) 75 MG tablet; TAKE 1 TABLET(75 MG) BY MOUTH DAILY  Dispense: 90 tablet; Refill: 3 - COMPLETE METABOLIC PANEL WITH GFR - Lipid  panel - Comprehensive metabolic panel  3. Hemiplegia of left 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, unspecified hemiplegia type (HCC) Recent stroke, compliant with meds, poor f/up folllowing stroke - ezetimibe (ZETIA) 10 MG tablet; Take 1 tablet (10 mg total) by mouth daily.  Dispense: 90 tablet; Refill: 3 - clopidogrel (PLAVIX) 75 MG tablet; TAKE 1 TABLET(75 MG) BY MOUTH DAILY  Dispense: 90 tablet; Refill: 3 - COMPLETE METABOLIC PANEL WITH GFR - CBC with Differential/Platelet -  Hemoglobin A1c - Lipid panel  4. History of ischemic stroke See #3 - ezetimibe (ZETIA) 10 MG tablet; Take 1 tablet (10 mg total) by mouth daily.  Dispense: 90 tablet; Refill: 3 - clopidogrel (PLAVIX) 75 MG tablet; TAKE 1 TABLET(75 MG) BY MOUTH DAILY  Dispense: 90 tablet; Refill: 3 - COMPLETE METABOLIC PANEL WITH GFR - Lipid panel  5. Prediabetes Hx of prediabetes, recheck labs - COMPLETE METABOLIC PANEL WITH GFR - Hemoglobin A1c - Comprehensive metabolic panel  6. Heart murmur Reported, cannot find ECHO or cardiac documentation, with recent stroke may need to establish with cardiology locally? Pt hesitant to do anything right now  7. Visit for monitoring Plavix therapy Scant BRBPR on toilet paper - no melena and hematochezia  - CBC with Differential/Platelet  8. Encounter for medication monitoring - COMPLETE METABOLIC PANEL WITH GFR - CBC with Differential/Platelet - Hemoglobin A1c - Lipid panel - Comprehensive metabolic panel  9. Blurry vision Gradual, pt advised to see optometrist  10. Insomnia, unspecified type Trial of vistaril, sleep hygiene handout given, f/up in one month - OV visit dedicated to new acute complaints.  Pt refused to do labs today and noted he would return to complete  Return for 3-4 week follow up on med change HTN and insomnia .   Danelle Berry, PA-C 07/16/19 10:00 AM

## 2019-07-16 NOTE — Patient Instructions (Addendum)
Start taking 20 + 10 mg lisinopril daily - check your blood pressure readings at home when relaxed and bring in readings to recheck in the next 3-4 weeks.    Try hydroxyzine 1 to 2 tablets at bedtime to see if it helps you sleep.  See info below about sleep hygiene   Get your labs done in the next couple weeks - walk in during open clinic hours - lab 8-12 and 2-4  Blood Pressure Record Sheet  Blood pressure log Date: _______________________  a.m. _____________________(1st reading) _____________________(2nd reading)  p.m. _____________________(1st reading) _____________________(2nd reading) Date: _______________________  a.m. _____________________(1st reading) _____________________(2nd reading)  p.m. _____________________(1st reading) _____________________(2nd reading) Date: _______________________  a.m. _____________________(1st reading) _____________________(2nd reading)  p.m. _____________________(1st reading) _____________________(2nd reading) Date: _______________________  a.m. _____________________(1st reading) _____________________(2nd reading)  p.m. _____________________(1st reading) _____________________(2nd reading) Date: _______________________  a.m. _____________________(1st reading) _____________________(2nd reading)  p.m. _____________________(1st reading) _____________________(2nd reading) Date: _______________________  a.m. _____________________(1st reading) _____________________(2nd reading)  p.m. _____________________(1st reading) _____________________(2nd reading) Date: _______________________  a.m. _____________________(1st reading) _____________________(2nd reading)  p.m. _____________________(1st reading) _____________________(2nd reading) Date: _______________________  a.m. _____________________(1st reading) _____________________(2nd reading)  p.m. _____________________(1st reading) _____________________(2nd reading) Date:  _______________________  a.m. _____________________(1st reading) _____________________(2nd reading)  p.m. _____________________(1st reading) _____________________(2nd reading) Date: _______________________  a.m. _____________________(1st reading) _____________________(2nd reading)  p.m. _____________________(1st reading) _____________________(2nd reading)     How to Take Your Blood Pressure You can take your blood pressure at home with a machine. You may need to check your blood pressure at home:  To check if you have high blood pressure (hypertension).  To check your blood pressure over time.  To make sure your blood pressure medicine is working. Supplies needed: You will need a blood pressure machine, or monitor. You can buy one at a drugstore or online. When choosing one:  Choose one with an arm cuff.  Choose one that wraps around your upper arm. Only one finger should fit between your arm and the cuff.  Do not choose one that measures your blood pressure from your wrist or finger. Your doctor can suggest a monitor. How to prepare Avoid these things for 30 minutes before checking your blood pressure:  Drinking caffeine.  Drinking alcohol.  Eating.  Smoking.  Exercising. Five minutes before checking your blood pressure:  Pee.  Sit in a dining chair. Avoid sitting in a soft couch or armchair.  Be quiet. Do not talk. How to take your blood pressure Follow the instructions that came with your machine. If you have a digital blood pressure monitor, these may be the instructions: 1. Sit up straight. 2. Place your feet on the floor. Do not cross your ankles or legs. 3. Rest your left arm at the level of your heart. You may rest it on a table, desk, or chair. 4. Pull up your shirt sleeve. 5. Wrap the blood pressure cuff around the upper part of your left arm. The cuff should be 1 inch (2.5 cm) above your elbow. It is best to wrap the cuff around bare skin. 6. Fit  the cuff snugly around your arm. You should be able to place only one finger between the cuff and your arm. 7. Put the cord inside the groove of your elbow. 8. Press the power button. 9. Sit quietly while the cuff fills with air and loses air. 10. Write down the numbers on the screen. 11. Wait 2-3 minutes and then repeat steps 1-10. What do the numbers mean? Two numbers make up your blood pressure. The  first number is called systolic pressure. The second is called diastolic pressure. An example of a blood pressure reading is "120 over 80" (or 120/80). If you are an adult and do not have a medical condition, use this guide to find out if your blood pressure is normal: Normal  First number: below 120.  Second number: below 80. Elevated  First number: 120-129.  Second number: below 80. Hypertension stage 1  First number: 130-139.  Second number: 80-89. Hypertension stage 2  First number: 140 or above.  Second number: 90 or above. Your blood pressure is above normal even if only the top or bottom number is above normal. Follow these instructions at home:  Check your blood pressure as often as your doctor tells you to.  Take your monitor to your next doctor's appointment. Your doctor will: ? Make sure you are using it correctly. ? Make sure it is working right.  Make sure you understand what your blood pressure numbers should be.  Tell your doctor if your medicines are causing side effects. Contact a doctor if:  Your blood pressure keeps being high. Get help right away if:  Your first blood pressure number is higher than 180.  Your second blood pressure number is higher than 120. This information is not intended to replace advice given to you by your health care provider. Make sure you discuss any questions you have with your health care provider. Document Revised: 01/04/2017 Document Reviewed: 07/01/2015 Elsevier Patient Education  2020 [employer].  Sleep Hygiene  Tips 1) Get regular. One of the best ways to train your body to sleep well is to go to bed and get up at more or less the same time every day, even on weekends and days off! This regular rhythm will make you feel better and will give your body something to work from. 2) Sleep when sleepy. Only try to sleep when you actually feel tired or sleepy, rather than spending too much time awake in bed. 3) Get up & try again. If you haven't been able to get to sleep after about 20 minutes or more, get up and do something calming or boring until you feel sleepy, then return to bed and try again. Sit quietly on the couch with the lights off (bright light will tell your brain that it is time to wake up), or read something boring like the phone book. Avoid doing anything that is too stimulating or interesting, as this will wake you up even more. 4) Avoid caffeine & nicotine. It is best to avoid consuming any caffeine (in coffee, tea, cola drinks, chocolate, and some medications) or nicotine (cigarettes) for at least 4-6 hours before going to bed. These substances act as stimulants and interfere with the ability to fall asleep 5) Avoid alcohol. It is also best to avoid alcohol for at least 4-6 hours before going to bed. Many people believe that alcohol is relaxing and helps them to get to sleep at first, but it actually interrupts the quality of sleep. 6) Bed is for sleeping. Try not to use your bed for anything other than sleeping and sex, so that your body comes to associate bed with sleep. If you use bed as a place to watch TV, eat, read, work on your laptop, pay bills, and other things, your body will not learn this Connection. 7) No naps. It is best to avoid taking naps during the day, to make sure that you are tired at bedtime. If you  can't make it through the day without a nap, make sure it is for less than an hour and before 3pm. 8) Sleep rituals. You can develop your own rituals of  things to remind your body that it is time to sleep - some people find it useful to do relaxing stretches or breathing exercises for 15 minutes before bed each night, or sit calmly with a cup of caffeine-free tea. 9) Bathtime. Having a hot bath 1-2 hours before bedtime can be useful, as it will raise your body temperature, causing you to feel sleepy as your body temperature drops again. Research shows that sleepiness is associated with a drop in body temperature. 10) No clock-watching. Many people who struggle with sleep tend to watch the clock too much. Frequently checking the clock during the night can wake you up (especially if you turn on the light to read the time) and reinforces negative thoughts such as "Oh no, look how late it is, I'll never get to sleep" or "it's so early, I have only slept for 5 hours, this is terrible." 11) Use a sleep diary. This worksheet can be a useful way of making sure you have the right facts about your sleep, rather than making assumptions. Because a diary involves watching the clock (see point 10) it is a good idea to only use it for two weeks to get an idea of what is going and then perhaps two months down the track to see how you are progressing. 12) Exercise. Regular exercise is a good idea to help with good sleep, but try not to do strenuous exercise in the 4 hours before bedtime. Morning walks are a great way to start the day feeling refreshed! 13) Eat right. A healthy, balanced diet will help you to sleep well, but timing is important. Some people find that a very empty stomach at bedtime is distracting, so it can be useful to have a light snack, but a heavy meal soon before bed can also interrupt sleep. Some people recommend a warm glass of milk, which contains tryptophan, which acts as a natural sleep inducer. 14) The right space. It is very important that your bed and bedroom are quiet and comfortable for sleeping. A cooler room with  enough blankets to stay warm is best, and make sure you have curtains or an eyemask to block out early morning light and earplugs if there is noise outside your room. 15) Keep daytime routine the same. Even if you have a bad night sleep and are tired it is important that you try to keep your daytime activities the same as you had planned. That is, don't avoid activities because you feel tired. This can reinforce the insomnia.    Insomnia Insomnia is a sleep disorder that makes it difficult to fall asleep or stay asleep. Insomnia can cause fatigue, low energy, difficulty concentrating, mood swings, and poor performance at work or school. There are three different ways to classify insomnia:  Difficulty falling asleep.  Difficulty staying asleep.  Waking up too early in the morning. Any type of insomnia can be long-term (chronic) or short-term (acute). Both are common. Short-term insomnia usually lasts for three months or less. Chronic insomnia occurs at least three times a week for longer than three months. What are the causes? Insomnia may be caused by another condition, situation, or substance, such as:  Anxiety.  Certain medicines.  Gastroesophageal reflux disease (GERD) or other gastrointestinal conditions.  Asthma or other breathing conditions.  Restless  legs syndrome, sleep apnea, or other sleep disorders.  Chronic pain.  Menopause.  Stroke.  Abuse of alcohol, tobacco, or illegal drugs.  Mental health conditions, such as depression.  Caffeine.  Neurological disorders, such as Alzheimer's disease.  An overactive thyroid (hyperthyroidism). Sometimes, the cause of insomnia may not be known. What increases the risk? Risk factors for insomnia include:  Gender. Women are affected more often than men.  Age. Insomnia is more common as you get older.  Stress.  Lack of exercise.  Irregular work schedule or working night shifts.  Traveling between different  time zones.  Certain medical and mental health conditions. What are the signs or symptoms? If you have insomnia, the main symptom is having trouble falling asleep or having trouble staying asleep. This may lead to other symptoms, such as:  Feeling fatigued or having low energy.  Feeling nervous about going to sleep.  Not feeling rested in the morning.  Having trouble concentrating.  Feeling irritable, anxious, or depressed. How is this diagnosed? This condition may be diagnosed based on:  Your symptoms and medical history. Your health care provider may ask about: ? Your sleep habits. ? Any medical conditions you have. ? Your mental health.  A physical exam. How is this treated? Treatment for insomnia depends on the cause. Treatment may focus on treating an underlying condition that is causing insomnia. Treatment may also include:  Medicines to help you sleep.  Counseling or therapy.  Lifestyle adjustments to help you sleep better. Follow these instructions at home: Eating and drinking   Limit or avoid alcohol, caffeinated beverages, and cigarettes, especially close to bedtime. These can disrupt your sleep.  Do not eat a large meal or eat spicy foods right before bedtime. This can lead to digestive discomfort that can make it hard for you to sleep. Sleep habits   Keep a sleep diary to help you and your health care provider figure out what could be causing your insomnia. Write down: ? When you sleep. ? When you wake up during the night. ? How well you sleep. ? How rested you feel the next day. ? Any side effects of medicines you are taking. ? What you eat and drink.  Make your bedroom a dark, comfortable place where it is easy to fall asleep. ? Put up shades or blackout curtains to block light from outside. ? Use a white noise machine to block noise. ? Keep the temperature cool.  Limit screen use before bedtime. This includes: ? Watching TV. ? Using your  smartphone, tablet, or computer.  Stick to a routine that includes going to bed and waking up at the same times every day and night. This can help you fall asleep faster. Consider making a quiet activity, such as reading, part of your nighttime routine.  Try to avoid taking naps during the day so that you sleep better at night.  Get out of bed if you are still awake after 15 minutes of trying to sleep. Keep the lights down, but try reading or doing a quiet activity. When you feel sleepy, go back to bed. General instructions  Take over-the-counter and prescription medicines only as told by your health care provider.  Exercise regularly, as told by your health care provider. Avoid exercise starting several hours before bedtime.  Use relaxation techniques to manage stress. Ask your health care provider to suggest some techniques that may work well for you. These may include: ? Breathing exercises. ? Routines to release muscle  tension. ? Visualizing peaceful scenes.  Make sure that you drive carefully. Avoid driving if you feel very sleepy.  Keep all follow-up visits as told by your health care provider. This is important. Contact a health care provider if:  You are tired throughout the day.  You have trouble in your daily routine due to sleepiness.  You continue to have sleep problems, or your sleep problems get worse. Get help right away if:  You have serious thoughts about hurting yourself or someone else. If you ever feel like you may hurt yourself or others, or have thoughts about taking your own life, get help right away. You can go to your nearest emergency department or call:  Your local emergency services (911 in the [place]  A suicide crisis helpline, such as the [place] [facility] at (620)288-4968. This is open 24 hours a day. Summary  Insomnia is a sleep disorder that makes it difficult to fall asleep or stay asleep.  Insomnia can be long-term  (chronic) or short-term (acute).  Treatment for insomnia depends on the cause. Treatment may focus on treating an underlying condition that is causing insomnia.  Keep a sleep diary to help you and your health care provider figure out what could be causing your insomnia. This information is not intended to replace advice given to you by your health care provider. Make sure you discuss any questions you have with your health care provider. Document Revised: 01/04/2017 Document Reviewed: 11/01/2016 Elsevier Patient Education  2020 [employer].

## 2019-08-14 ENCOUNTER — Ambulatory Visit: Payer: BC Managed Care – PPO | Admitting: Family Medicine

## 2019-09-03 ENCOUNTER — Ambulatory Visit: Payer: BC Managed Care – PPO | Admitting: Family Medicine

## 2019-09-29 ENCOUNTER — Encounter: Payer: Self-pay | Admitting: Family Medicine

## 2019-09-29 DIAGNOSIS — I10 Essential (primary) hypertension: Secondary | ICD-10-CM

## 2019-09-29 DIAGNOSIS — N521 Erectile dysfunction due to diseases classified elsewhere: Secondary | ICD-10-CM

## 2019-09-30 ENCOUNTER — Other Ambulatory Visit: Payer: Self-pay | Admitting: Family Medicine

## 2019-09-30 MED ORDER — LISINOPRIL 20 MG PO TABS: 20.0000 mg | ORAL_TABLET | Freq: Every day | ORAL | 3 refills | Status: DC

## 2019-11-16 ENCOUNTER — Encounter: Payer: Self-pay | Admitting: Family Medicine

## 2019-11-16 ENCOUNTER — Other Ambulatory Visit: Payer: Self-pay

## 2019-11-16 ENCOUNTER — Ambulatory Visit: Payer: BC Managed Care – PPO | Admitting: Family Medicine

## 2019-11-16 VITALS — BP 128/72 | HR 75 | Temp 98.4°F | Resp 14 | Ht 72.0 in | Wt 211.0 lb

## 2019-11-16 DIAGNOSIS — E782 Mixed hyperlipidemia: Secondary | ICD-10-CM

## 2019-11-16 DIAGNOSIS — I69354 Hemiplegia and hemiparesis following cerebral infarction affecting left non-dominant side: Secondary | ICD-10-CM

## 2019-11-16 DIAGNOSIS — I6601 Occlusion and stenosis of right middle cerebral artery: Secondary | ICD-10-CM

## 2019-11-16 DIAGNOSIS — R059 Cough, unspecified: Secondary | ICD-10-CM

## 2019-11-16 DIAGNOSIS — R7303 Prediabetes: Secondary | ICD-10-CM | POA: Diagnosis not present

## 2019-11-16 DIAGNOSIS — F1120 Opioid dependence, uncomplicated: Secondary | ICD-10-CM

## 2019-11-16 DIAGNOSIS — I10 Essential (primary) hypertension: Secondary | ICD-10-CM | POA: Diagnosis not present

## 2019-11-16 DIAGNOSIS — Z5181 Encounter for therapeutic drug level monitoring: Secondary | ICD-10-CM

## 2019-11-16 DIAGNOSIS — Z8673 Personal history of transient ischemic attack (TIA), and cerebral infarction without residual deficits: Secondary | ICD-10-CM

## 2019-11-16 DIAGNOSIS — Z7902 Long term (current) use of antithrombotics/antiplatelets: Secondary | ICD-10-CM

## 2019-11-16 DIAGNOSIS — J329 Chronic sinusitis, unspecified: Secondary | ICD-10-CM

## 2019-11-16 MED ORDER — MOMETASONE FUROATE 50 MCG/ACT NA SUSP: 2.0000 | Freq: Every day | NASAL | 12 refills | Status: DC

## 2019-11-16 MED ORDER — CETIRIZINE HCL 10 MG PO TABS: 10.0000 mg | ORAL_TABLET | Freq: Every day | ORAL | 11 refills | Status: DC

## 2019-11-16 MED ORDER — DOXYCYCLINE HYCLATE 100 MG PO TABS: 100.0000 mg | ORAL_TABLET | Freq: Two times a day (BID) | ORAL | 0 refills | Status: AC

## 2019-11-16 NOTE — Progress Notes (Addendum)
Name: Charles Alvarado   MRN: [id number]    DOB: 1970-02-19   Date:11/16/2019       Progress Note  Chief Complaint  Patient presents with  . Follow-up  . Hypertension  . Hyperlipidemia  . Sinusitis    pressure coughing up green stuff     Subjective:   Charles Alvarado is a 49 y.o. male, presents to clinic for routine f/up and also recurrent/chronic sinusitis  Last OV 07/16/2019, pt was new to me, prior OV with PCP was 02/24/2019 Pt did not complete labs, last labs from 12/2018  Hypertension:  Currently managed on managed wth lisinopril 20 mg daily Last OV was not well controlled and home BP readings high - he agreed to increase to 30 mg lisinopril daily and f/up for BP recheck, he did not return for that recheck appt.  Stress had been high at work and he wanted to work on diet/lifestyle.  He resumed 20 mg daily and BP today is well controlled and better than last OV Pt reports good med compliance and denies any SE.   Blood pressure today is well controlled. BP Readings from Last 3 Encounters:  11/16/19 128/72  07/16/19 140/82  12/09/18 124/72   Pt denies CP, SOB, exertional sx, LE edema, palpitation, Ha's, visual disturbances, lightheadedness, hypotension, syncope.   Hyperlipidemia:  LDL goal <70 lipitor 40 and zetia, good compliance [facility] records show he was previously on a higher dose of Lipitor) Last Lipids: Lab Results  Component Value Date   CHOL 150 12/09/2018   HDL 36 (L) 12/09/2018   LDLCALC 93 12/09/2018   TRIG 112 12/09/2018   CHOLHDL 4.2 12/09/2018   - Denies: Chest pain, shortness of breath, myalgias, claudication   On plavix and asa, hx of ischemic stroke  09/2018 x 2 Pt presented to [facility] emergency room with left-sided paresthesias and weakness of left lower extremity, he was admitted for acute CVA Multiple studies between 09/12/2018 and 11/02/2018, lost to f/up with [facility] neuro - notes of pt being advised to continue DAPT past 90 d MRI  brain w/wo contrast, MRA neck w contrast, MRA head w/o, MRI cervical spine with and w/o, CTA head w/w/o and failed LP, negative  Dx of intracranial stenosis - most notable to distal right MCA and anterior cerebral arteries Pt had worsening sx in September and imaging was redone - neuro could not reach pt with results - Jan visit was scheduled but does not appear done - reviewed through care everywhere, unclear what records are not visible to me.    Addendum to imaging results available - noted that pt was discussed at neuroradiology departments weekly interesting care conference and vessel wall imaging was recommended for further eval.   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 He reports onset of worsening pressure and sinus discomfort in bilateral maxillary sinuses and around to behind both of his eyes for the past 2 weeks, gradually worsening. He denies fever, chills, sweats, myalgias, headaches. He does note that he is coughing up yellow to green phlegm but denies any chest pain, congestion, shortness of breath, wheeze.      Current Outpatient Medications:  .  aspirin 81 MG chewable tablet, Chew by mouth., Disp: , Rfl:  .  atorvastatin (LIPITOR) 40 MG tablet, Take 1 tablet (40 mg total) by mouth daily., Disp: 90 tablet, Rfl: 3 .  clopidogrel (PLAVIX) 75 MG tablet, TAKE 1 TABLET(75 MG) BY MOUTH DAILY, Disp: 90 tablet, Rfl: 3 .  ezetimibe (ZETIA) 10 MG tablet, Take 1 tablet (10 mg total) by mouth daily., Disp: 90 tablet, Rfl: 3 .  lisinopril (ZESTRIL) 20 MG tablet, Take 1 tablet (20 mg total) by mouth daily., Disp: 90 tablet, Rfl: 3 .  oxyCODONE-acetaminophen (PERCOCET) 10-325 MG tablet, TK 1 T PO 5 TIMES A DAY, Disp: , Rfl:  .  tadalafil (CIALIS) 20 MG tablet, Take 0.5-1 tablets (10-20 mg total) by mouth every other day as needed for erectile dysfunction., Disp:  10 tablet, Rfl: 3  Patient Active Problem List   Diagnosis Date Noted  . 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 History of ischemic stroke 12/10/2018  . Prediabetes 12/10/2018  . Intracranial vascular stenosis 09/13/2018  . Heart murmur 02/21/2015  . Essential hypertension 02/21/2015  . Mixed hyperlipidemia 02/21/2015    Past Surgical History:  Procedure Laterality Date  . CLOSED REDUCTION SHOULDER DISLOCATION  06-2015  . SHOULDER ARTHROSCOPY WITH OPEN ROTATOR CUFF REPAIR AND DISTAL CLAVICLE ACROMINECTOMY Right 06/16/2015   Procedure: right shoulder arthroscopic repair of labral tear;  Surgeon: Juanell Fairly, MD;  Location: [facility] ORS;  Service: Orthopedics;  Laterality: Right;    Family History  Problem Relation Age of Onset  . Hypertension Mother   . Hypertension Father   . Diabetes Father   . Diabetes Sister   . Hypertension Sister     Social History   Tobacco Use  . Smoking status: Never Smoker  . Smokeless tobacco: Never Used  Vaping Use  . Vaping Use: Never used  Substance Use Topics  . Alcohol use: No    Alcohol/week: 0.0 standard drinks  . Drug use: No     No Known Allergies  Health Maintenance  Topic Date Due  . COVID-19 Vaccine (1) Never done  . INFLUENZA VACCINE  09/06/2019  . Hepatitis C Screening  07/15/2020 (Originally April 24, 1970)  . HIV Screening  07/15/2020 (Originally 08/26/1985)  . TETANUS/TDAP  04/24/2025    Chart Review Today: I personally reviewed active problem list, medication list, allergies, family history, social history, health maintenance, notes from last encounter, lab results, imaging with the patient/caregiver today.   Review of Systems  10 Systems reviewed and are negative for acute change except as noted in the HPI.  Objective:   Vitals:   11/16/19 1159  BP: 128/72  Pulse: 75  Resp: 14  Temp: 98.4 F (36.9 C)  SpO2: 99%  Weight: 211 lb (95.7 kg)  Height: 6' (1.829  m)    Body mass index is 28.62 kg/m.  Physical Exam Vitals and nursing note reviewed.  Constitutional:      General: He is not in acute distress.    Appearance: Normal appearance. He is well-developed. He is not ill-appearing, toxic-appearing or diaphoretic.     Interventions: Face mask in place.  HENT:     Head: Normocephalic and atraumatic.     Jaw: No trismus.     Right Ear: External ear normal.     Left Ear: External ear normal.     Nose: Mucosal edema and congestion present.     Right Turbinates: Enlarged and swollen.     Left Turbinates: Enlarged and swollen.     Right Sinus: No maxillary sinus tenderness or frontal sinus tenderness.     Left Sinus: No maxillary sinus tenderness or frontal sinus tenderness.  Mouth/Throat:     Mouth: Mucous membranes are moist.     Pharynx: Oropharynx is clear. Uvula midline. Posterior oropharyngeal erythema present. No pharyngeal swelling, oropharyngeal exudate or uvula swelling.  Eyes:     General: Lids are normal. No scleral icterus.       Right eye: No discharge.        Left eye: No discharge.     Conjunctiva/sclera: Conjunctivae normal.  Neck:     Trachea: Trachea and phonation normal. No tracheal deviation.  Cardiovascular:     Rate and Rhythm: Normal rate and regular rhythm.     Pulses: Normal pulses.          Radial pulses are 2+ on the right side and 2+ on the left side.       Posterior tibial pulses are 2+ on the right side and 2+ on the left side.     Heart sounds: Normal heart sounds. No murmur heard.  No friction rub. No gallop.   Pulmonary:     Effort: Pulmonary effort is normal. No respiratory distress.     Breath sounds: Normal breath sounds. No stridor. No wheezing, rhonchi or rales.  Abdominal:     General: Bowel sounds are normal. There is no distension.     Palpations: Abdomen is soft.  Musculoskeletal:     Cervical back: Normal range of motion and neck supple.     Right lower leg: No edema.     Left lower  leg: No edema.  Lymphadenopathy:     Head:     Right side of head: No submental, submandibular, tonsillar, preauricular, posterior auricular or occipital adenopathy.     Left side of head: No submental, submandibular, tonsillar, preauricular, posterior auricular or occipital adenopathy.     Cervical: No cervical adenopathy.  Skin:    General: Skin is warm and dry.     Coloration: Skin is not jaundiced.     Findings: No rash.     Nails: There is no clubbing.  Neurological:     Mental Status: He is alert. Mental status is at baseline.     Cranial Nerves: No dysarthria or facial asymmetry.     Motor: No tremor or abnormal muscle tone.     Gait: Gait normal.  Psychiatric:        Mood and Affect: Mood normal.        Speech: Speech normal.        Behavior: Behavior normal. Behavior is cooperative.         Assessment & Plan:   1. Essential hypertension Stable, well-controlled blood pressure on lisinopril 20 mg,continue diet/lifestyle/DASH efforts Labs were ordered previously patient never completed them after his office visit 4 months ago CMP today recheck renal function and electrolytes  2. Mixed hyperlipidemia Patient compliant with statin, on Lipitor 40 mg and Zetia 10 mg daily he denies any myalgias side effects or concerns other than chronic fatigue which has not changed since taking his medications. Last lipid panel was done almost a year ago we'll obtain lipid panel and CMP today, goal of LDL <70 due to hx of ischemic stroke  3. Encounter for medication monitoring CBC, CMP, lipid panel and A1c ordered and obtained today  4. Visit for monitoring Plavix therapy Patient has questions about Plavix, lost to follow-up from [facility] neurology after his stroke a little over 1 year ago Unclear plan from what I can see in care everywhere records - there are notes of 90 d plavix  and ASA use, and then other notes of continuing plavix longer due to progression of sx - pt seems to have been  lost to f/up after that - roughly one year ago. Last PCP was waiting for the f/up neuro regarding plavix Pt would like to est locally with neurology - will place referral - refills put in for current meds, [facility] or new neuro consult regarding antiplatelet tx - does not seem that workup was ever completed.  5. Prediabetes History of prediabetes his last A1c was normal this was about a year ago we'll repeat today  6. 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 History of ischemic stroke Compliant with statin - labs to see if LDL at goal, HTN well controlled, on 81 mg ASA and plavix - defer to neuro regarding DAPT?  -established with local neurology  8. Recurrent sinusitis Encourage patient to restart daily rhinosinusitis medications to manage his nasal turbinate swelling and any seasonal allergy triggers, since he has had worsening symptoms for more than 2 weeks will treat with antibiotics, encouraged him to be compliant with daily intranasal steroids and antihistamines and to let me know if he desires to follow-up with ENT - doxycycline (VIBRA-TABS) 100 MG tablet; Take 1 tablet (100 mg total) by mouth 2 (two) times daily for 7 days.  Dispense: 14 tablet; Refill: 0 - cetirizine (ZYRTEC) 10 MG tablet; Take 1 tablet (10 mg total) by mouth daily.  Dispense: 30 tablet; Refill: 11 - mometasone (NASONEX) 50 MCG/ACT nasal spray; Place 2 sprays into the nose daily.  Dispense: 1 each; Refill: 12  9. Cough He notes mildly productive cough yellow to green sputum without fever chills sweats shortness of breath or chest pain, lungs were clear on exam today likely secondary to #8 he had some  erythema to nasal mucosa and to his posterior oropharynx may be viral URI versus worsening seasonal allergies? - doxycycline (VIBRA-TABS) 100 MG tablet; Take 1 tablet (100 mg total) by mouth 2 (two) times daily for 7 days.  Dispense: 14 tablet; Refill: 0  10. Narcotic dependence (HCC)  F11.20    pt recieving #150 10 mg percocet monthly from Gay FillerNataliy Corbin, AGNP-BC Physical [facility] and [facility] and Physiatry - PDMP reviewed     Return in about 6 months (around 05/16/2020) for Routine follow-up.   Danelle BerryLeisa Delphia Kaylor, PA-C 11/16/19 12:06 PM

## 2019-11-16 NOTE — Patient Instructions (Addendum)
Restart a daily antihistamine and steroid nasal spray to help manage the chronic nasal and sinus symptoms. Let me know if you want to have ENT recheck you.      Sinusitis, Adult Sinusitis is soreness and swelling (inflammation) of your sinuses. Sinuses are hollow spaces in the bones around your face. They are located:  Around your eyes.  In the middle of your forehead.  Behind your nose.  In your cheekbones. Your sinuses and nasal passages are lined with a fluid called mucus. Mucus drains out of your sinuses. Swelling can trap mucus in your sinuses. This lets germs (bacteria, virus, or fungus) grow, which leads to infection. Most of the time, this condition is caused by a virus. What are the causes? This condition is caused by:  Allergies.  Asthma.  Germs.  Things that block your nose or sinuses.  Growths in the nose (nasal polyps).  Chemicals or irritants in the air.  Fungus (rare). What increases the risk? You are more likely to develop this condition if:  You have a weak body defense system (immune system).  You do a lot of swimming or diving.  You use nasal sprays too much.  You smoke. What are the signs or symptoms? The main symptoms of this condition are pain and a feeling of pressure around the sinuses. Other symptoms include:  Stuffy nose (congestion).  Runny nose (drainage).  Swelling and warmth in the sinuses.  Headache.  Toothache.  A cough that may get worse at night.  Mucus that collects in the throat or the back of the nose (postnasal drip).  Being unable to smell and taste.  Being very tired (fatigue).  A fever.  Sore throat.  Bad breath. How is this diagnosed? This condition is diagnosed based on:  Your symptoms.  Your medical history.  A physical exam.  Tests to find out if your condition is short-term (acute) or long-term (chronic). Your doctor may: ? Check your nose for growths (polyps). ? Check your sinuses using a  tool that has a light (endoscope). ? Check for allergies or germs. ? Do imaging tests, such as an MRI or CT scan. How is this treated? Treatment for this condition depends on the cause and whether it is short-term or long-term.  If caused by a virus, your symptoms should go away on their own within 10 days. You may be given medicines to relieve symptoms. They include: ? Medicines that shrink swollen tissue in the nose. ? Medicines that treat allergies (antihistamines). ? A spray that treats swelling of the nostrils. ? Rinses that help get rid of thick mucus in your nose (nasal saline washes).  If caused by bacteria, your doctor may wait to see if you will get better without treatment. You may be given antibiotic medicine if you have: ? A very bad infection. ? A weak body defense system.  If caused by growths in the nose, you may need to have surgery. Follow these instructions at home: Medicines  Take, use, or apply over-the-counter and prescription medicines only as told by your doctor. These may include nasal sprays.  If you were prescribed an antibiotic medicine, take it as told by your doctor. Do not stop taking the antibiotic even if you start to feel better. Hydrate and humidify   Drink enough water to keep your pee (urine) pale yellow.  Use a cool mist humidifier to keep the humidity level in your home above 50%.  Breathe in steam for 10-15 minutes, 3-4  times a day, or as told by your doctor. You can do this in the bathroom while a hot shower is running.  Try not to spend time in cool or dry air. Rest  Rest as much as you can.  Sleep with your head raised (elevated).  Make sure you get enough sleep each night. General instructions   Put a warm, moist washcloth on your face 3-4 times a day, or as often as told by your doctor. This will help with discomfort.  Wash your hands often with soap and water. If there is no soap and water, use hand sanitizer.  Do not  smoke. Avoid being around people who are smoking (secondhand smoke).  Keep all follow-up visits as told by your doctor. This is important. Contact a doctor if:  You have a fever.  Your symptoms get worse.  Your symptoms do not get better within 10 days. Get help right away if:  You have a very bad headache.  You cannot stop throwing up (vomiting).  You have very bad pain or swelling around your face or eyes.  You have trouble seeing.  You feel confused.  Your neck is stiff.  You have trouble breathing. Summary  Sinusitis is swelling of your sinuses. Sinuses are hollow spaces in the bones around your face.  This condition is caused by tissues in your nose that become inflamed or swollen. This traps germs. These can lead to infection.  If you were prescribed an antibiotic medicine, take it as told by your doctor. Do not stop taking it even if you start to feel better.  Keep all follow-up visits as told by your doctor. This is important. This information is not intended to replace advice given to you by your health care provider. Make sure you discuss any questions you have with your health care provider. Document Revised: 06/24/2017 Document Reviewed: 06/24/2017 Elsevier Patient Education  2020 [employer].

## 2019-11-17 LAB — LIPID PANEL
Cholesterol: 118 mg/dL (ref <200)
HDL: 40 mg/dL (ref >=40)
LDL Cholesterol (Calc): 67 mg/dL (calc)
Non-HDL Cholesterol (Calc): 78 mg/dL (calc) (ref <130)
Total CHOL/HDL Ratio: 3 (calc) (ref <5.0)
Triglycerides: 39 mg/dL (ref <150)

## 2019-11-17 LAB — COMPLETE METABOLIC PANEL WITH GFR
AG Ratio: 1.7 (calc) (ref 1.0–2.5)
ALT: 36 U/L (ref 9–46)
AST: 22 U/L (ref 10–40)
Albumin: 4.6 g/dL (ref 3.6–5.1)
Alkaline phosphatase (APISO): 91 U/L (ref 36–130)
BUN: 18 mg/dL (ref 7–25)
CO2: 29 mmol/L (ref 20–32)
Calcium: 9.9 mg/dL (ref 8.6–10.3)
Chloride: 105 mmol/L (ref 98–110)
Creat: 1.1 mg/dL (ref 0.60–1.35)
GFR, Est African American: 91 mL/min/{1.73_m2} (ref >=60)
GFR, Est Non African American: 78 mL/min/{1.73_m2} (ref >=60)
Globulin: 2.7 g/dL (calc) (ref 1.9–3.7)
Glucose, Bld: 98 mg/dL (ref 65–99)
Potassium: 4.8 mmol/L (ref 3.5–5.3)
Sodium: 139 mmol/L (ref 135–146)
Total Bilirubin: 0.5 mg/dL (ref 0.2–1.2)
Total Protein: 7.3 g/dL (ref 6.1–8.1)

## 2019-11-17 LAB — CBC WITH DIFFERENTIAL/PLATELET
Absolute Monocytes: 410 cells/uL (ref 200–950)
Basophils Absolute: 70 cells/uL (ref 0–200)
Basophils Relative: 1.4 %
Eosinophils Absolute: 120 cells/uL (ref 15–500)
Eosinophils Relative: 2.4 %
HCT: 39.7 % (ref 38.5–50.0)
Hemoglobin: 13.4 g/dL (ref 13.2–17.1)
Lymphs Abs: 1725 cells/uL (ref 850–3900)
MCH: 27.6 pg (ref 27.0–33.0)
MCHC: 33.8 g/dL (ref 32.0–36.0)
MCV: 81.9 fL (ref 80.0–100.0)
MPV: 9.7 fL (ref 7.5–12.5)
Monocytes Relative: 8.2 %
Neutro Abs: 2675 cells/uL (ref 1500–7800)
Neutrophils Relative %: 53.5 %
Platelets: 214 10*3/uL (ref 140–400)
RBC: 4.85 10*6/uL (ref 4.20–5.80)
RDW: 12.5 % (ref 11.0–15.0)
Total Lymphocyte: 34.5 %
WBC: 5 10*3/uL (ref 3.8–10.8)

## 2019-11-17 LAB — HEMOGLOBIN A1C
Hgb A1c MFr Bld: 5.8 % of total Hgb — ABNORMAL HIGH (ref <5.7)
Mean Plasma Glucose: 120 (calc)
eAG (mmol/L): 6.6 (calc)

## 2020-02-01 ENCOUNTER — Encounter: Payer: Self-pay | Admitting: Family Medicine

## 2020-05-16 ENCOUNTER — Other Ambulatory Visit: Payer: Self-pay

## 2020-05-16 ENCOUNTER — Ambulatory Visit: Payer: BC Managed Care – PPO | Admitting: Family Medicine

## 2020-05-16 ENCOUNTER — Encounter: Payer: Self-pay | Admitting: Family Medicine

## 2020-05-16 VITALS — BP 124/78 | HR 98 | Temp 98.2°F | Resp 18 | Ht 72.0 in | Wt 223.9 lb

## 2020-05-16 DIAGNOSIS — J329 Chronic sinusitis, unspecified: Secondary | ICD-10-CM

## 2020-05-16 DIAGNOSIS — E782 Mixed hyperlipidemia: Secondary | ICD-10-CM

## 2020-05-16 DIAGNOSIS — R7303 Prediabetes: Secondary | ICD-10-CM | POA: Diagnosis not present

## 2020-05-16 DIAGNOSIS — M25522 Pain in left elbow: Secondary | ICD-10-CM

## 2020-05-16 DIAGNOSIS — I69354 Hemiplegia and hemiparesis following cerebral infarction affecting left non-dominant side: Secondary | ICD-10-CM

## 2020-05-16 DIAGNOSIS — I679 Cerebrovascular disease, unspecified: Secondary | ICD-10-CM

## 2020-05-16 DIAGNOSIS — Z1211 Encounter for screening for malignant neoplasm of colon: Secondary | ICD-10-CM

## 2020-05-16 DIAGNOSIS — Z8673 Personal history of transient ischemic attack (TIA), and cerebral infarction without residual deficits: Secondary | ICD-10-CM

## 2020-05-16 DIAGNOSIS — I10 Essential (primary) hypertension: Secondary | ICD-10-CM | POA: Diagnosis not present

## 2020-05-16 DIAGNOSIS — Z5181 Encounter for therapeutic drug level monitoring: Secondary | ICD-10-CM

## 2020-05-16 DIAGNOSIS — J302 Other seasonal allergic rhinitis: Secondary | ICD-10-CM

## 2020-05-16 DIAGNOSIS — M25521 Pain in right elbow: Secondary | ICD-10-CM

## 2020-05-16 DIAGNOSIS — Z7902 Long term (current) use of antithrombotics/antiplatelets: Secondary | ICD-10-CM

## 2020-05-16 MED ORDER — CLOPIDOGREL BISULFATE 75 MG PO TABS: ORAL_TABLET | ORAL | 3 refills | Status: DC

## 2020-05-16 MED ORDER — EZETIMIBE 10 MG PO TABS
10.0000 mg | ORAL_TABLET | Freq: Every day | ORAL | 3 refills | Status: DC
Start: 2020-05-16 — End: 2021-06-12

## 2020-05-16 MED ORDER — DOXYCYCLINE HYCLATE 100 MG PO CAPS: 100.0000 mg | ORAL_CAPSULE | Freq: Two times a day (BID) | ORAL | 0 refills | Status: DC

## 2020-05-16 MED ORDER — LEVOCETIRIZINE DIHYDROCHLORIDE 5 MG PO TABS
5.0000 mg | ORAL_TABLET | Freq: Every evening | ORAL | 2 refills | Status: DC
Start: 2020-05-16 — End: 2022-02-12

## 2020-05-16 MED ORDER — IPRATROPIUM BROMIDE 0.03 % NA SOLN: 2.0000 | Freq: Two times a day (BID) | NASAL | 12 refills | Status: DC

## 2020-05-16 MED ORDER — ATORVASTATIN CALCIUM 40 MG PO TABS: 40.0000 mg | ORAL_TABLET | Freq: Every day | ORAL | 3 refills | Status: DC

## 2020-05-16 NOTE — Patient Instructions (Signed)
Call and get follow up with neurology please:  Alphonzo Lemmings, MD (Attending) [phone] (Work) [phone] (Fax) [phone] [facility] [address] [facility] [place], [place] [address] Neurology

## 2020-05-16 NOTE — Progress Notes (Signed)
Name: Charles Alvarado   MRN: [id number]    DOB: 01-29-1971   Date:05/16/2020       Progress Note  Chief Complaint  Patient presents with  . Follow-up  . Hyperlipidemia  . Allergic Rhinitis   . Tendonitis    Elbows      Subjective:   Charles Alvarado is a 50 y.o. male, presents to clinic for routine f/up, allergies and elbow pain.  Hx of CVA with intracranial stenosis on plavix, asa, zetia and statin - lost to f/up with neurology. Stroke was early 2020 work up initially at [facility] - referred last year to [facility] neurology, he did not get seen since last oct Prior extensive chart review re stroke and imaging results copied here again - On plavix and asa, hx of ischemic stroke  09/2018 x 2 Pt presented to [facility] emergency room with left-sided paresthesias and weakness of left lower extremity, he was admitted for acute CVA Multiple studies between 09/12/2018 and 11/02/2018, lost to f/up with [facility] neuro - notes of pt being advised to continue DAPT past 90 d MRI brain w/wo contrast, MRA neck w contrast, MRA head w/o, MRI cervical spine with and w/o, CTA head w/w/o and failed LP, negative  Dx of intracranial stenosis - most notable to distal right MCA and anterior cerebral arteries Pt had worsening sx in September and imaging was redone - neuro could not reach pt with results - Jan visit was scheduled but does not appear done - reviewed through care everywhere, unclear what records are not visible to me.    Addendum to imaging results available - noted that pt was discussed at neuroradiology departments weekly interesting care conference and vessel wall imaging was recommended for further eval. Notes of vasculitis? He never saw vascular specialists    Hypertension:  Currently managed on lisinopril 20 mg daily Pt reports goopd med compliance and denies any SE.   Blood pressure today is well controlled. BP Readings from Last 3 Encounters:  05/16/20 124/78  11/16/19  128/72  07/16/19 140/82   Pt denies CP, SOB, exertional sx, LE edema, palpitation, Ha's, visual disturbances, lightheadedness, hypotension, syncope. Dietary efforts for BP? Healthy He just feels sluggish, tired, denies exertional sx  Hyperlipidemia: Currently treated with lipitor 40 mg daily and zetia, pt reports good med compliance Last Lipids: Lab Results  Component Value Date   CHOL 118 11/16/2019   HDL 40 11/16/2019   LDLCALC 67 11/16/2019   TRIG 39 11/16/2019   CHOLHDL 3.0 11/16/2019   - Denies: Chest pain, shortness of breath, myalgias, claudication  SA on steroid nasal spray and antihistamines, recurrent sinusitis- sx worse over the past 3 weeks, he was feeling well controlled on zyrtec and mometasone but then insurance stopped paying for it or he stopped taking it Sinus HA, congestion, nasal discharge and some productive cough worse in the last couple weeks, feels like he has a sinus infection again  Pt on plavix and ASA still - sometimes has some blood on toilet paper after BM, but no blood in stool, denies melena, hematuria, spontaneous bruising  Complains of bilateral elbow pain, no new physical activity, no trauma, denies swelling, redness He lifts often using machines not free weights, elbow pain x 1.5 months doesn't improve with resting or padding.  Pain management - on percocet daily   Current Outpatient Medications:  .  aspirin 81 MG chewable tablet, Chew by mouth., Disp: , Rfl:  .  atorvastatin (LIPITOR) 40 MG tablet,  Take 1 tablet (40 mg total) by mouth daily., Disp: 90 tablet, Rfl: 3 .  cetirizine (ZYRTEC) 10 MG tablet, Take 1 tablet (10 mg total) by mouth daily., Disp: 30 tablet, Rfl: 11 .  clopidogrel (PLAVIX) 75 MG tablet, TAKE 1 TABLET(75 MG) BY MOUTH DAILY, Disp: 90 tablet, Rfl: 3 .  ezetimibe (ZETIA) 10 MG tablet, Take 1 tablet (10 mg total) by mouth daily., Disp: 90 tablet, Rfl: 3 .  lisinopril (ZESTRIL) 20 MG tablet, Take 1 tablet (20 mg total) by mouth  daily., Disp: 90 tablet, Rfl: 3 .  mometasone (NASONEX) 50 MCG/ACT nasal spray, Place 2 sprays into the nose daily., Disp: 1 each, Rfl: 12 .  oxyCODONE-acetaminophen (PERCOCET) 10-325 MG tablet, TK 1 T PO 5 TIMES A DAY, Disp: , Rfl:  .  tadalafil (CIALIS) 20 MG tablet, Take 0.5-1 tablets (10-20 mg total) by mouth every other day as needed for erectile dysfunction., Disp: 10 tablet, Rfl: 3 .  ibuprofen (ADVIL) 800 MG tablet, Take 800 mg by mouth 2 (two) times daily., Disp: , Rfl:   Patient Active Problem List   Diagnosis Date Noted  . 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 History of ischemic stroke 12/10/2018  . Prediabetes 12/10/2018  . Intracranial vascular stenosis 09/13/2018  . Heart murmur 02/21/2015  . Essential hypertension 02/21/2015  . Mixed hyperlipidemia 02/21/2015    Past Surgical History:  Procedure Laterality Date  . CLOSED REDUCTION SHOULDER DISLOCATION  06-2015  . SHOULDER ARTHROSCOPY WITH OPEN ROTATOR CUFF REPAIR AND DISTAL CLAVICLE ACROMINECTOMY Right 06/16/2015   Procedure: right shoulder arthroscopic repair of labral tear;  Surgeon: Juanell Fairly, MD;  Location: [facility] ORS;  Service: Orthopedics;  Laterality: Right;    Family History  Problem Relation Age of Onset  . Hypertension Mother   . Hypertension Father   . Diabetes Father   . Diabetes Sister   . Hypertension Sister     Social History   Tobacco Use  . Smoking status: Never Smoker  . Smokeless tobacco: Never Used  Vaping Use  . Vaping Use: Never used  Substance Use Topics  . Alcohol use: No    Alcohol/week: 0.0 standard drinks  . Drug use: No     No Known Allergies  Health Maintenance  Topic Date Due  . COLONOSCOPY (Pts 45-55yrs Insurance coverage will need to be confirmed)  Never done  . Hepatitis C Screening  07/15/2020 (Originally 1970/05/20)  . HIV Screening  07/15/2020 (Originally [birth year]/26/1985)  . COVID-19 Vaccine (3 - Booster for  Pfizer series) 07/04/2020  . INFLUENZA VACCINE  09/05/2020  . TETANUS/TDAP  04/24/2025  . HPV VACCINES  Aged Out    Chart Review Today: I personally reviewed active problem list, medication list, allergies, family history, social history, health maintenance, notes from last encounter, lab results, imaging with the patient/caregiver today.   Review of Systems  Constitutional: Positive for fatigue. Negative for activity change, appetite change, chills, diaphoresis and fever.  HENT: Positive for congestion, postnasal drip, rhinorrhea, sinus pressure, sinus pain and sneezing. Negative for dental problem, drooling, ear discharge, ear pain, facial swelling, hearing loss, mouth sores, nosebleeds, sore throat, tinnitus, trouble swallowing and voice change.   Eyes: Negative.   Respiratory: Positive for cough. Negative for choking, chest tightness, shortness of breath, wheezing and stridor.   Cardiovascular: Negative.  Negative for chest pain and leg swelling.  Gastrointestinal: Negative.  Negative for abdominal distention, abdominal pain, constipation, diarrhea, nausea and vomiting.  Endocrine: Negative.   Genitourinary: Negative.  Negative for decreased urine volume, difficulty urinating, dysuria, enuresis, flank pain, frequency, hematuria and urgency.  Musculoskeletal: Positive for arthralgias. Negative for myalgias.  Skin: Negative.  Negative for color change, pallor and rash.  Allergic/Immunologic: Negative.   Neurological: Positive for headaches (sinus). Negative for dizziness, tremors, seizures, syncope, facial asymmetry, speech difficulty, weakness, light-headedness and numbness.  Hematological: Negative.   Psychiatric/Behavioral: Positive for sleep disturbance. Negative for behavioral problems, confusion, decreased concentration and dysphoric mood.  All other systems reviewed and are negative.    Objective:   Vitals:   05/16/20 1135  BP: 124/78  Pulse: 98  Resp: 18  Temp: 98.2 F  (36.8 C)  SpO2: 99%  Weight: 223 lb 14.4 oz (101.6 kg)  Height: 6' (1.829 m)    Body mass index is 30.37 kg/m.  Physical Exam Vitals and nursing note reviewed.  Constitutional:      General: He is not in acute distress.    Appearance: Normal appearance. He is well-developed and normal weight. He is not ill-appearing, toxic-appearing or diaphoretic.     Interventions: Face mask in place.  HENT:     Head: Normocephalic and atraumatic.     Jaw: No trismus.     Salivary Glands: Right salivary gland is not diffusely enlarged or tender. Left salivary gland is not diffusely enlarged or tender.     Right Ear: Hearing, tympanic membrane, ear canal and external ear normal. There is no impacted cerumen.     Left Ear: Hearing, tympanic membrane, ear canal and external ear normal. There is no impacted cerumen.     Nose: Mucosal edema, congestion and rhinorrhea present. Rhinorrhea is clear.     Right Nostril: No epistaxis or septal hematoma.     Left Nostril: No epistaxis or septal hematoma.     Right Turbinates: Enlarged and swollen.     Left Turbinates: Enlarged and swollen.     Right Sinus: Frontal sinus tenderness present. No maxillary sinus tenderness.     Left Sinus: Frontal sinus tenderness present. No maxillary sinus tenderness.     Comments: Diffusely erythematous nasal mucosa and nasal turbinates    Mouth/Throat:     Mouth: Mucous membranes are moist. Mucous membranes are not pale, not dry and not cyanotic.     Pharynx: Oropharynx is clear. Uvula midline. No oropharyngeal exudate, posterior oropharyngeal erythema or uvula swelling.     Tonsils: No tonsillar exudate or tonsillar abscesses.  Eyes:     General: Lids are normal. No scleral icterus.       Right eye: No discharge.        Left eye: No discharge.     Conjunctiva/sclera: Conjunctivae normal.     Pupils: Pupils are equal, round, and reactive to light.  Neck:     Trachea: Trachea and phonation normal. No tracheal deviation.   Cardiovascular:     Rate and Rhythm: Normal rate and regular rhythm.     Pulses: Normal pulses.          Radial pulses are 2+ on the right side and 2+ on the left side.       Posterior tibial pulses are 2+ on the right side and 2+ on the left side.     Heart sounds: Normal heart sounds. No murmur heard. No friction rub. No gallop.   Pulmonary:     Effort: Pulmonary effort is normal. No tachypnea, accessory muscle usage or respiratory distress.     Breath sounds:  Normal breath sounds. No stridor. No decreased breath sounds, wheezing, rhonchi or rales.  Abdominal:     General: Bowel sounds are normal. There is no distension.     Palpations: Abdomen is soft.     Tenderness: There is no abdominal tenderness.  Musculoskeletal:        General: Normal range of motion.     Cervical back: Normal range of motion and neck supple.     Right lower leg: No edema.     Left lower leg: No edema.  Lymphadenopathy:     Cervical: No cervical adenopathy.  Skin:    General: Skin is warm and dry.     Capillary Refill: Capillary refill takes less than 2 seconds.     Coloration: Skin is not jaundiced or pale.     Findings: No rash.     Nails: There is no clubbing.  Neurological:     Mental Status: He is alert. Mental status is at baseline.     Cranial Nerves: No dysarthria or facial asymmetry.     Motor: No tremor or abnormal muscle tone.     Coordination: Coordination normal.     Gait: Gait normal.  Psychiatric:        Mood and Affect: Mood normal.        Speech: Speech normal.        Behavior: Behavior normal. Behavior is cooperative.         Assessment & Plan:     ICD-10-CM   1. Mixed hyperlipidemia  E78.2 atorvastatin (LIPITOR) 40 MG tablet    clopidogrel (PLAVIX) 75 MG tablet    ezetimibe (ZETIA) 10 MG tablet    COMPLETE METABOLIC PANEL WITH GFR    Lipid panel    Ambulatory referral to Neurology   LDL last OV was <70, on lipitor 40 and zetia  2. Hemiplegia of left nondominant side  as late effect of cerebral infarction, unspecified hemiplegia type (HCC)  I69.354 clopidogrel (PLAVIX) 75 MG tablet    ezetimibe (ZETIA) 10 MG tablet    COMPLETE METABOLIC PANEL WITH GFR    Lipid panel    Ambulatory referral to Neurology   needs f/up with neuro, and possibly with neurosurgery or vascular specialists - stroke in 2020 with worse sx sept 2020, did not f/up with multiple specialists  3. Essential hypertension  I10 COMPLETE METABOLIC PANEL WITH GFR    Ambulatory referral to Neurology   stable, well controlled today  4. Prediabetes  R73.03 COMPLETE METABOLIC PANEL WITH GFR    Hemoglobin A1c   recheck  5. Visit for monitoring Plavix therapy  Z51.81 atorvastatin (LIPITOR) 40 MG tablet   Z79.02 clopidogrel (PLAVIX) 75 MG tablet    ezetimibe (ZETIA) 10 MG tablet    CBC with Differential/Platelet    COMPLETE METABOLIC PANEL WITH GFR    Urinalysis, Routine w reflex microscopic    Ambulatory referral to Neurology   pt lost to f/up with neuro- needs f/up to determine continued need for plavix - discussed again with pt, he has neuro # on AVS today  6. Encounter for medication monitoring  Z51.81 CBC with Differential/Platelet    COMPLETE METABOLIC PANEL WITH GFR    Lipid panel    Hemoglobin A1c    Urinalysis, Routine w reflex microscopic  7. Screening for colon cancer  Z12.11 Cologuard  8. Pain of both elbows  M25.521    M25.522    tender w/o effusion, signs of bursitis, effusion, good rom bilaterally, no improvement  with resting, ref to ortho for further eval/tx  9. Seasonal allergies  J30.2 Ambulatory referral to Orthopedics    levocetirizine (XYZAL) 5 MG tablet    ipratropium (ATROVENT) 0.03 % nasal spray    Ambulatory referral to ENT   encouraged him to remain on daily antihistamine meds and nose sprays, with recurrent infections may need to see ENT again  10. Recurrent sinusitis  J32.9 Ambulatory referral to Orthopedics    levocetirizine (XYZAL) 5 MG tablet    ipratropium  (ATROVENT) 0.03 % nasal spray    doxycycline (VIBRAMYCIN) 100 MG capsule    Ambulatory referral to ENT   abx for sx severe and lasting more than 3 weeks with sinus ttp on exam today, encouraged good compliance to allergy meds  11. Intracranial vascular stenosis  I67.9 clopidogrel (PLAVIX) 75 MG tablet    ezetimibe (ZETIA) 10 MG tablet    CBC with Differential/Platelet    COMPLETE METABOLIC PANEL WITH GFR    Lipid panel    Ambulatory referral to Neurology     Return in about 6 months (around 11/15/2020) for Routine follow-up.   Danelle Berry, PA-C 05/16/20 11:56 AM

## 2020-05-17 LAB — CBC WITH DIFFERENTIAL/PLATELET
Absolute Monocytes: 360 cells/uL (ref 200–950)
Basophils Absolute: 70 cells/uL (ref 0–200)
Basophils Relative: 1.4 %
Eosinophils Absolute: 70 cells/uL (ref 15–500)
Eosinophils Relative: 1.4 %
HCT: 42.3 % (ref 38.5–50.0)
Hemoglobin: 14.6 g/dL (ref 13.2–17.1)
Lymphs Abs: 1600 cells/uL (ref 850–3900)
MCH: 28.5 pg (ref 27.0–33.0)
MCHC: 34.5 g/dL (ref 32.0–36.0)
MCV: 82.5 fL (ref 80.0–100.0)
MPV: 9.4 fL (ref 7.5–12.5)
Monocytes Relative: 7.2 %
Neutro Abs: 2900 cells/uL (ref 1500–7800)
Neutrophils Relative %: 58 %
Platelets: 207 10*3/uL (ref 140–400)
RBC: 5.13 10*6/uL (ref 4.20–5.80)
RDW: 12.6 % (ref 11.0–15.0)
Total Lymphocyte: 32 %
WBC: 5 10*3/uL (ref 3.8–10.8)

## 2020-05-17 LAB — URINALYSIS, ROUTINE W REFLEX MICROSCOPIC
Bilirubin Urine: NEGATIVE
Glucose, UA: NEGATIVE
Hgb urine dipstick: NEGATIVE
Ketones, ur: NEGATIVE
Leukocytes,Ua: NEGATIVE
Nitrite: NEGATIVE
Protein, ur: NEGATIVE
Specific Gravity, Urine: 1.023 (ref 1.001–1.03)
pH: 6 (ref 5.0–8.0)

## 2020-05-17 LAB — COMPLETE METABOLIC PANEL WITH GFR
AG Ratio: 1.7 (calc) (ref 1.0–2.5)
ALT: 34 U/L (ref 9–46)
AST: 24 U/L (ref 10–40)
Albumin: 4.7 g/dL (ref 3.6–5.1)
Alkaline phosphatase (APISO): 95 U/L (ref 36–130)
BUN: 16 mg/dL (ref 7–25)
CO2: 29 mmol/L (ref 20–32)
Calcium: 10.1 mg/dL (ref 8.6–10.3)
Chloride: 104 mmol/L (ref 98–110)
Creat: 1.16 mg/dL (ref 0.60–1.35)
GFR, Est African American: 85 mL/min/{1.73_m2} (ref >=60)
GFR, Est Non African American: 74 mL/min/{1.73_m2} (ref >=60)
Globulin: 2.7 g/dL (calc) (ref 1.9–3.7)
Glucose, Bld: 103 mg/dL — ABNORMAL HIGH (ref 65–99)
Potassium: 5 mmol/L (ref 3.5–5.3)
Sodium: 139 mmol/L (ref 135–146)
Total Bilirubin: 0.6 mg/dL (ref 0.2–1.2)
Total Protein: 7.4 g/dL (ref 6.1–8.1)

## 2020-05-17 LAB — HEMOGLOBIN A1C
Hgb A1c MFr Bld: 6 % of total Hgb — ABNORMAL HIGH (ref <5.7)
Mean Plasma Glucose: 126 mg/dL
eAG (mmol/L): 7 mmol/L

## 2020-05-17 LAB — LIPID PANEL
Cholesterol: 112 mg/dL (ref <200)
HDL: 37 mg/dL — ABNORMAL LOW (ref >=40)
LDL Cholesterol (Calc): 60 mg/dL (calc)
Non-HDL Cholesterol (Calc): 75 mg/dL (calc) (ref <130)
Total CHOL/HDL Ratio: 3 (calc) (ref <5.0)
Triglycerides: 74 mg/dL (ref <150)

## 2020-11-07 ENCOUNTER — Other Ambulatory Visit: Payer: Self-pay | Admitting: Family Medicine

## 2020-11-07 DIAGNOSIS — I10 Essential (primary) hypertension: Secondary | ICD-10-CM

## 2020-11-07 MED ORDER — LISINOPRIL 20 MG PO TABS: 20.0000 mg | ORAL_TABLET | Freq: Every day | ORAL | 3 refills | Status: DC

## 2020-11-14 ENCOUNTER — Ambulatory Visit: Payer: BC Managed Care – PPO | Admitting: Family Medicine

## 2020-12-06 ENCOUNTER — Other Ambulatory Visit: Payer: Self-pay

## 2020-12-06 ENCOUNTER — Ambulatory Visit: Payer: BC Managed Care – PPO | Admitting: Family Medicine

## 2020-12-06 ENCOUNTER — Encounter: Payer: Self-pay | Admitting: Family Medicine

## 2020-12-06 VITALS — BP 156/86 | HR 92 | Temp 98.0°F | Resp 18 | Ht 72.0 in | Wt 230.6 lb

## 2020-12-06 DIAGNOSIS — Z114 Encounter for screening for human immunodeficiency virus [HIV]: Secondary | ICD-10-CM

## 2020-12-06 DIAGNOSIS — Z5181 Encounter for therapeutic drug level monitoring: Secondary | ICD-10-CM

## 2020-12-06 DIAGNOSIS — I679 Cerebrovascular disease, unspecified: Secondary | ICD-10-CM

## 2020-12-06 DIAGNOSIS — Z7902 Long term (current) use of antithrombotics/antiplatelets: Secondary | ICD-10-CM

## 2020-12-06 DIAGNOSIS — Z1211 Encounter for screening for malignant neoplasm of colon: Secondary | ICD-10-CM

## 2020-12-06 DIAGNOSIS — Z1159 Encounter for screening for other viral diseases: Secondary | ICD-10-CM

## 2020-12-06 DIAGNOSIS — R519 Headache, unspecified: Secondary | ICD-10-CM

## 2020-12-06 DIAGNOSIS — J0141 Acute recurrent pansinusitis: Secondary | ICD-10-CM

## 2020-12-06 DIAGNOSIS — I10 Essential (primary) hypertension: Secondary | ICD-10-CM

## 2020-12-06 DIAGNOSIS — Z8673 Personal history of transient ischemic attack (TIA), and cerebral infarction without residual deficits: Secondary | ICD-10-CM

## 2020-12-06 DIAGNOSIS — R7303 Prediabetes: Secondary | ICD-10-CM

## 2020-12-06 DIAGNOSIS — F1120 Opioid dependence, uncomplicated: Secondary | ICD-10-CM

## 2020-12-06 DIAGNOSIS — E782 Mixed hyperlipidemia: Secondary | ICD-10-CM

## 2020-12-06 DIAGNOSIS — R202 Paresthesia of skin: Secondary | ICD-10-CM

## 2020-12-06 MED ORDER — AMOXICILLIN-POT CLAVULANATE 875-125 MG PO TABS: 1.0000 | ORAL_TABLET | Freq: Two times a day (BID) | ORAL | 0 refills | Status: DC

## 2020-12-06 MED ORDER — LISINOPRIL 20 MG PO TABS: 40.0000 mg | ORAL_TABLET | Freq: Every day | ORAL | Status: DC

## 2020-12-06 NOTE — Progress Notes (Signed)
Name: PRASHANT GLOSSER   MRN: [id number]    DOB: 09/17/1970   Date:12/06/2020       Progress Note  Chief Complaint  Patient presents with   Follow-up   Hypertension    Pt is concerned about BP being high, would like to know if needs to change dose.   Hyperlipidemia     Subjective:   SHOMARI SCICCHITANO is a 50 y.o. male, presents to clinic for f/up, again presents with elevated BP and suspected sinus infection  He still has not gotten f/up on his stroke   Prior stroke/stenosis hx: On plavix and asa, hx of ischemic stroke  09/2018 x 2 Pt presented to [facility] emergency room with left-sided paresthesias and weakness of left lower extremity, he was admitted for acute CVA Multiple studies between 09/12/2018 and 11/02/2018, lost to f/up with [facility] neuro - notes of pt being advised to continue DAPT past 90 d MRI brain w/wo contrast, MRA neck w contrast, MRA head w/o, MRI cervical spine with and w/o, CTA head w/w/o and failed LP, negative  Dx of intracranial stenosis - most notable to distal right MCA and anterior cerebral arteries Pt had worsening sx in September and imaging was redone - neuro could not reach pt with results - Jan visit was scheduled but does not appear done - reviewed through care everywhere, unclear what records are not visible to me.     Addendum to imaging results available - noted that pt was discussed at neuroradiology departments weekly interesting care conference and vessel wall imaging was recommended for further eval.  4. Visit for monitoring Plavix therapy Patient has questions about Plavix, lost to follow-up from [facility] neurology after his stroke a little over 1 year ago Unclear plan from what I can see in care everywhere records - there are notes of 90 d plavix and ASA use, and then other notes of continuing plavix longer due to progression of sx - pt seems to have been lost to f/up after that - roughly one year ago. Last PCP was waiting for the f/up neuro  regarding plavix Pt would like to est locally with neurology - will place referral - refills put in for current meds, defer to [facility] neuro f/up or new neuro consult regarding antiplatelet tx - does not seem that workup was ever completed.  Interval hx: Pt was referred to local neurology but did not go for appt He states today he would like to go back to [facility] for f/up  Hypertension:  Currently managed on lisinopril 20  Pt reports good med compliance and denies any SE.   Blood pressure today is elevated/uncontrolled. BP Readings from Last 3 Encounters:  12/06/20 (!) 156/86  05/16/20 124/78  11/16/19 128/72   Pt denies CP, SOB, exertional sx, LE edema, palpitation, visual disturbances, lightheadedness, hypotension, syncope. He has Headaches and can feel his BP is high - will sometimes take a second lisinopril dose No other diet/lifestyle efforts  Concerns for sinus infection, acute on chronic nasal and sinus sx with pressure to face and severe behind his eyes, no fever Still using nasal sprays and currently using otc claritin  Hyperlipidemia: Currently treated with statin and zetia Last Lipids: Lab Results  Component Value Date   CHOL 112 05/16/2020   HDL 37 (L) 05/16/2020   LDLCALC 60 05/16/2020   TRIG 74 05/16/2020   CHOLHDL 3.0 05/16/2020   - Denies: Chest pain, shortness of breath, myalgias, claudication  Prediabetes:  strong family  hx of DM Denies: Polyuria, polydipsia, vision changes, neuropathy, hypoglycemia Recent pertinent labs: Lab Results  Component Value Date   HGBA1C 6.0 (H) 05/16/2020   HGBA1C 5.8 (H) 11/16/2019   HGBA1C 5.5 12/09/2018   Lab Results  Component Value Date   LDLCALC 60 05/16/2020   CREATININE 1.16 05/16/2020   Obesity - weight increased since last OV and about 20+ lbs up since last year - eats out a lot, [place] second shift in [place] from Last 5 Encounters:  12/06/20 230 lb 9.6 oz (104.6 kg)  05/16/20 223 lb 14.4 oz (101.6 kg)   11/16/19 211 lb (95.7 kg)  07/16/19 211 lb 4.8 oz (95.8 kg)  12/09/18 213 lb (96.6 kg)   BMI Readings from Last 5 Encounters:  12/06/20 31.27 kg/m  05/16/20 30.37 kg/m  11/16/19 28.62 kg/m  07/16/19 27.13 kg/m  12/09/18 27.35 kg/m   Wakes up with tingling in legs sometimes, no back pain and hx of pain/injury, eats meat and veggies salads, no swelling to LE  Chronic pain management- on oxycodone per specialists and on amitriptyline  HA's other than sinus HA's - takes 800 mg ibuprofen a few times a month, no BC/goodie powders, no tylenol use    Current Outpatient Medications:    aspirin 81 MG chewable tablet, Chew by mouth., Disp: , Rfl:    atorvastatin (LIPITOR) 40 MG tablet, Take 1 tablet (40 mg total) by mouth daily., Disp: 90 tablet, Rfl: 3   clopidogrel (PLAVIX) 75 MG tablet, TAKE 1 TABLET(75 MG) BY MOUTH DAILY, Disp: 90 tablet, Rfl: 3   ezetimibe (ZETIA) 10 MG tablet, Take 1 tablet (10 mg total) by mouth daily., Disp: 90 tablet, Rfl: 3   ipratropium (ATROVENT) 0.03 % nasal spray, Place 2 sprays into both nostrils every 12 (twelve) hours., Disp: 30 mL, Rfl: 12   levocetirizine (XYZAL) 5 MG tablet, Take 1 tablet (5 mg total) by mouth every evening., Disp: 90 tablet, Rfl: 2   lisinopril (ZESTRIL) 20 MG tablet, Take 1 tablet (20 mg total) by mouth daily., Disp: 90 tablet, Rfl: 3   mometasone (NASONEX) 50 MCG/ACT nasal spray, Place 2 sprays into the nose daily., Disp: 1 each, Rfl: 12   oxyCODONE-acetaminophen (PERCOCET) 10-325 MG tablet, TK 1 T PO 5 TIMES A DAY, Disp: , Rfl:    tadalafil (CIALIS) 20 MG tablet, Take 0.5-1 tablets (10-20 mg total) by mouth every other day as needed for erectile dysfunction., Disp: 10 tablet, Rfl: 3   amitriptyline (ELAVIL) 25 MG tablet, Take 25 mg by mouth at bedtime., Disp: , Rfl:    ID NOW COVID-19 KIT, TEST AS DIRECTED TODAY (Patient not taking: Reported on 12/06/2020), Disp: , Rfl:   Patient Active Problem List   Diagnosis Date Noted    Hemiplegia of left 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 ([place]) 12/10/2018   History of ischemic stroke 12/10/2018   Prediabetes 12/10/2018   Intracranial vascular stenosis 09/13/2018   Heart murmur 02/21/2015   Essential hypertension 02/21/2015   Mixed hyperlipidemia 02/21/2015    Past Surgical History:  Procedure Laterality Date   CLOSED REDUCTION SHOULDER DISLOCATION  06-2015   SHOULDER ARTHROSCOPY WITH OPEN ROTATOR CUFF REPAIR AND DISTAL CLAVICLE ACROMINECTOMY Right 06/16/2015   Procedure: right shoulder arthroscopic repair of labral tear;  Surgeon: Thornton Park, MD;  Location: [facility] ORS;  Service: Orthopedics;  Laterality: Right;    Family History  Problem Relation Age of Onset   Hypertension Mother    Hypertension Father  Diabetes Father    Diabetes Sister    Hypertension Sister     Social History   Tobacco Use   Smoking status: Never   Smokeless tobacco: Never  Vaping Use   Vaping Use: Never used  Substance Use Topics   Alcohol use: No    Alcohol/week: 0.0 standard drinks   Drug use: No     No Known Allergies  Health Maintenance  Topic Date Due   Hepatitis C Screening  Never done   Zoster Vaccines- Shingrix (1 of 2) Never done   COVID-19 Vaccine (3 - Booster for Pfizer series) 12/22/2020 (Originally 03/01/2020)   COLONOSCOPY (Pts 45-60yr Insurance coverage will need to be confirmed)  12/06/2021 (Originally 08/27/2015)   TETANUS/TDAP  04/24/2025   HIV Screening  Completed   Pneumococcal Vaccine [id number] old  Aged Out   HPV VACCINES  Aged Out   INFLUENZA VACCINE  Discontinued    Chart Review Today: I personally reviewed active problem list, medication list, allergies, family history, social history, health maintenance, notes from last encounter, lab results, imaging with the patient/caregiver today.   Review of Systems  Constitutional: Negative.   HENT: Negative.    Eyes: Negative.   Respiratory: Negative.    Cardiovascular:  Negative.   Gastrointestinal: Negative.   Endocrine: Negative.   Genitourinary: Negative.   Musculoskeletal: Negative.   Skin: Negative.   Allergic/Immunologic: Negative.   Neurological: Negative.   Hematological: Negative.   Psychiatric/Behavioral: Negative.    All other systems reviewed and are negative.   Objective:   Vitals:   12/06/20 1127 12/06/20 1142  BP: (!) 162/90 (!) 156/86  Pulse: 92   Resp: 18   Temp: 98 F (36.7 C)   TempSrc: Oral   SpO2: 98%   Weight: 230 lb 9.6 oz (104.6 kg)   Height: 6' (1.829 m)     Body mass index is 31.27 kg/m.  Physical Exam Vitals and nursing note reviewed.  Constitutional:      General: He is not in acute distress.    Appearance: Normal appearance. He is well-developed. He is not ill-appearing, toxic-appearing or diaphoretic.     Interventions: Face mask in place.  HENT:     Head: Normocephalic and atraumatic.     Jaw: No trismus.     Right Ear: Tympanic membrane, ear canal and external ear normal. There is no impacted cerumen.     Left Ear: Tympanic membrane, ear canal and external ear normal. There is no impacted cerumen.     Nose: Mucosal edema and congestion present. No rhinorrhea.     Right Turbinates: Enlarged and swollen.     Left Turbinates: Enlarged and swollen.     Right Sinus: No maxillary sinus tenderness or frontal sinus tenderness.     Left Sinus: No maxillary sinus tenderness or frontal sinus tenderness.     Comments: Ttp around eyes w/o periorbital edema/erythema    Mouth/Throat:     Mouth: Mucous membranes are moist.     Pharynx: Oropharynx is clear. Uvula midline. No pharyngeal swelling, oropharyngeal exudate, posterior oropharyngeal erythema or uvula swelling.  Eyes:     General: Lids are normal. No scleral icterus.       Right eye: No discharge.        Left eye: No discharge.     Conjunctiva/sclera: Conjunctivae normal.  Neck:     Trachea: Trachea and phonation normal. No tracheal deviation.   Cardiovascular:     Rate and Rhythm: Normal rate and  regular rhythm.     Pulses: Normal pulses.          Radial pulses are 2+ on the right side and 2+ on the left side.       Posterior tibial pulses are 2+ on the right side and 2+ on the left side.     Heart sounds: Normal heart sounds. No murmur heard.   No friction rub. No gallop.  Pulmonary:     Effort: Pulmonary effort is normal. No respiratory distress.     Breath sounds: Normal breath sounds. No stridor. No wheezing, rhonchi or rales.  Abdominal:     General: Bowel sounds are normal. There is no distension.     Palpations: Abdomen is soft.  Musculoskeletal:     Cervical back: Normal range of motion and neck supple.     Right lower leg: No edema.     Left lower leg: No edema.  Lymphadenopathy:     Head:     Right side of head: No submental, submandibular, tonsillar, preauricular, posterior auricular or occipital adenopathy.     Left side of head: No submental, submandibular, tonsillar, preauricular, posterior auricular or occipital adenopathy.     Cervical: No cervical adenopathy.  Skin:    General: Skin is warm and dry.     Coloration: Skin is not jaundiced.     Findings: No rash.     Nails: There is no clubbing.  Neurological:     Mental Status: He is alert. Mental status is at baseline.     Cranial Nerves: No dysarthria or facial asymmetry.     Motor: No tremor or abnormal muscle tone.     Gait: Gait normal.  Psychiatric:        Mood and Affect: Mood normal.        Speech: Speech normal.        Behavior: Behavior normal. Behavior is cooperative.        Assessment & Plan:     ICD-10-CM   1. Essential hypertension  A35 COMPLETE METABOLIC PANEL WITH GFR    lisinopril (ZESTRIL) 20 MG tablet    Ambulatory referral to Neurology   BP high today, previously we increased med dose, but he did not f/up and ran out of meds, increase lisinopril to 40 mg, recheck ~2 weeks, add meds if needed    2. Mixed hyperlipidemia   T73.2 COMPLETE METABOLIC PANEL WITH GFR    Ambulatory referral to Neurology   last lipids at goal LDL 60 - with statin and zetia    3. Prediabetes  R73.03 Hemoglobin A1C    COMPLETE METABOLIC PANEL WITH GFR    Ambulatory referral to Neurology   recheck, no current diet or lifestyle efforts, strong family hx of DM    4. Encounter for hepatitis C screening test for low risk patient  Z11.59 Hepatitis C Antibody    5. Screening for HIV without presence of risk factors  Z11.4 HIV antibody (with reflex)    6. Screening for colon cancer  Z12.11    refused    7. Antiplatelet or antithrombotic long-term use  K02.54 COMPLETE METABOLIC PANEL WITH GFR    CBC with Differential/Platelet    Ambulatory referral to Neurology   pt still needs to f/up with multiple specialists will try and get him back to [facility] neuro and vascular specialists    8. Acute recurrent pansinusitis  J01.41    consider ENT consult - recurrent, enlarged nasal turbinates, generalized pressure and mild ttp,  no fever, encouraged continued nasal spray and allergy meds    9. Encounter for medication monitoring  Z51.81 Hemoglobin A1C    COMPLETE METABOLIC PANEL WITH GFR    CBC with Differential/Platelet    10. Intracranial vascular stenosis  I67.9 Ambulatory referral to Neurology   was pending multidiscipline meeting re his case - vascular specialists/neuro [facility]    11. History of ischemic stroke  Z86.73 Ambulatory referral to Neurology   need f/up with specialists - multiple referrals over the past 2 years - but pt continues to not go to appts    12. Narcotic dependence ([place])  F11.20    high dosing daily percocet    13. Paresthesia of bilateral legs  R20.2    at night - basic labs today - when he returns will address further, no claudication sx    14. Nonintractable episodic headache, unspecified headache type  R51.9 Ambulatory referral to Neurology   manage allergies/possible ENT f/up, avoid NSAIDs, try tylenol or excedrine  migraine      HTN - poorly controlled  Take 40 mg lisinopril, close f/up - consider lisinopril-HCTZ  Avoid NSAIDS, DASH  Stroke/stenosis workup extensive -  late 2020 - f/up with [facility] -   Pt consulted with mutliple Mds in neuro department [place], [place],  [place], [place], [place]   [address]   [place], [place] [address]   [phone] HPI: Mr. IZRAEL PEAK is a 50 y.o.male with a PMHx of HTN seen in consultation at the [facility] at the request of Dr. Domingo Madeira for evaluation of ischemic stroke. They are requesting my opinion regarding potential etiologies, diagnostic work-up that should be considered, and/or treatment options entertained.   To review prior history, on 09/10/2018 patient first experienced TIA like symptoms at home with LLE weakness/numbness lasting 1 hour followed by full resolution. 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 By discharge on 8/8 his LUE strength was back to baseline but LLE weakness and LUE/LLE numbness persisted. He has a h/o HTN and takes lisinopril 20 mg daily. CTA on 8/14 significant for congenitally absent left ICA, Left A1 nonvisualized, Left MCA fed via an enlarged left PCA.There is high-grade stenosis of the mid and distal M1 segment of the right MCA. Mild focal narrowing of proximal right M2 MCA. There is decreased caliber of the M1 segment of the left MCA without significant superimposed stenosis. There is high-grade narrowing of the A2 segment of the left anterior cerebral artery (13:31). Vasculitis work-up  included normal ANA and slightly elevated ANCA (nonspecific). CRP and sed rate also WNL. Thalassemia profile was negative. He was discharged home on 8/8 with ASA, and statin as well as PLAVIX oer the SAMMPRIS trial.   At his 10/24/2018 visit patient reports continued balance issues as well as mental slowing. He is doing physical therapy once a week (ordered by his PCP) but feels he is only at 65% of his baseline. When he stands he states he has to move his feet to balance himself so he doesn't fall- denies any dizziness with standing. He feels both of his  legs are weak- when seen in the hospital he felt his R side was stronger, but now feels both of his legs are equal in weakness. He feels this started about three weeks ago when he felt a tingle down his right side (in fingers and foot) and says after that he felt both sides were weak. He did not go to the ED because he didn't think it was necessary at the time as his head didn't hurt and he had no pain. He does not use a cane or a walker. He is also endorsing problems with upper extremity weakness- for example he can't open a bag of chips as well anymore (worse on L, but does feel the R side got worse as well). Endorsing some frustration but denies depression. No changes in sensation, chest pain or SOB. But does endorse increased sweating when walking and working with PT.   Return in about 17 days (around 12/23/2020) for BP recheck appt and lab review.   Delsa Grana, PA-C 12/06/20 12:03 PM

## 2020-12-06 NOTE — Patient Instructions (Addendum)
Avoid NSAIDS - ibuprofen aleve and/or naproxen

## 2020-12-07 LAB — HEPATITIS C ANTIBODY
Hepatitis C Ab: NONREACTIVE
SIGNAL TO CUT-OFF: 0.06 (ref <1.00)

## 2020-12-07 LAB — COMPLETE METABOLIC PANEL WITH GFR
AG Ratio: 1.7 (calc) (ref 1.0–2.5)
ALT: 39 U/L (ref 9–46)
AST: 31 U/L (ref 10–35)
Albumin: 4.7 g/dL (ref 3.6–5.1)
Alkaline phosphatase (APISO): 97 U/L (ref 35–144)
BUN: 17 mg/dL (ref 7–25)
CO2: 28 mmol/L (ref 20–32)
Calcium: 10 mg/dL (ref 8.6–10.3)
Chloride: 103 mmol/L (ref 98–110)
Creat: 1.15 mg/dL (ref 0.70–1.30)
Globulin: 2.7 g/dL (calc) (ref 1.9–3.7)
Glucose, Bld: 99 mg/dL (ref 65–99)
Potassium: 4.5 mmol/L (ref 3.5–5.3)
Sodium: 137 mmol/L (ref 135–146)
Total Bilirubin: 0.5 mg/dL (ref 0.2–1.2)
Total Protein: 7.4 g/dL (ref 6.1–8.1)
eGFR: 78 mL/min/{1.73_m2} (ref >=60)

## 2020-12-07 LAB — CBC WITH DIFFERENTIAL/PLATELET
Absolute Monocytes: 365 cells/uL (ref 200–950)
Basophils Absolute: 50 cells/uL (ref 0–200)
Basophils Relative: 1.2 %
Eosinophils Absolute: 92 cells/uL (ref 15–500)
Eosinophils Relative: 2.2 %
HCT: 41.8 % (ref 38.5–50.0)
Hemoglobin: 14 g/dL (ref 13.2–17.1)
Lymphs Abs: 1428 cells/uL (ref 850–3900)
MCH: 27.9 pg (ref 27.0–33.0)
MCHC: 33.5 g/dL (ref 32.0–36.0)
MCV: 83.4 fL (ref 80.0–100.0)
MPV: 9.6 fL (ref 7.5–12.5)
Monocytes Relative: 8.7 %
Neutro Abs: 2264 cells/uL (ref 1500–7800)
Neutrophils Relative %: 53.9 %
Platelets: 209 10*3/uL (ref 140–400)
RBC: 5.01 10*6/uL (ref 4.20–5.80)
RDW: 13.2 % (ref 11.0–15.0)
Total Lymphocyte: 34 %
WBC: 4.2 10*3/uL (ref 3.8–10.8)

## 2020-12-07 LAB — HEMOGLOBIN A1C
Hgb A1c MFr Bld: 5.8 % of total Hgb — ABNORMAL HIGH (ref <5.7)
Mean Plasma Glucose: 120 mg/dL
eAG (mmol/L): 6.6 mmol/L

## 2020-12-07 LAB — HIV ANTIBODY (ROUTINE TESTING W REFLEX): HIV 1&2 Ab, 4th Generation: NONREACTIVE

## 2020-12-23 ENCOUNTER — Ambulatory Visit: Payer: BC Managed Care – PPO | Admitting: Nurse Practitioner

## 2020-12-26 ENCOUNTER — Encounter: Payer: Self-pay | Admitting: Nurse Practitioner

## 2020-12-26 ENCOUNTER — Ambulatory Visit: Payer: BC Managed Care – PPO | Admitting: Nurse Practitioner

## 2020-12-26 ENCOUNTER — Other Ambulatory Visit: Payer: Self-pay

## 2020-12-26 VITALS — BP 128/74 | HR 90 | Temp 98.2°F | Resp 18 | Ht 72.0 in | Wt 236.0 lb

## 2020-12-26 DIAGNOSIS — J0141 Acute recurrent pansinusitis: Secondary | ICD-10-CM | POA: Diagnosis not present

## 2020-12-26 DIAGNOSIS — Z1211 Encounter for screening for malignant neoplasm of colon: Secondary | ICD-10-CM | POA: Diagnosis not present

## 2020-12-26 DIAGNOSIS — I10 Essential (primary) hypertension: Secondary | ICD-10-CM | POA: Diagnosis not present

## 2020-12-26 MED ORDER — LISINOPRIL 40 MG PO TABS: 40.0000 mg | ORAL_TABLET | Freq: Every day | ORAL | 3 refills | Status: DC

## 2020-12-26 MED ORDER — DOXYCYCLINE HYCLATE 100 MG PO TABS: 100.0000 mg | ORAL_TABLET | Freq: Two times a day (BID) | ORAL | 0 refills | Status: AC

## 2020-12-26 NOTE — Progress Notes (Signed)
BP 128/74   Pulse 90   Temp 98.2 F (36.8 C) (Oral)   Resp 18   Ht 6' (1.829 m)   Wt 236 lb (107 kg)   SpO2 99%   BMI 32.01 kg/m    Subjective:    Patient ID: Charles Alvarado, male    DOB: [birth year], 50 y.o.   MRN: [id number]  HPI: Charles Alvarado is a 50 y.o. male  Chief Complaint  Patient presents with   Hypertension    Follow up   HTN: Last visit his blood pressure was elevated.  It was 156/86.  He was taking Lisinopril 20 mg.  He was increased to Lisinopril 40 mg daily. His blood pressure today is 128/74.  He says it has been running around 120/80 at home. He says he feels like this is helping his blood pressure.  He denies any headache, chest pain, shortness of breath, dizziness or blurred vision.  Updated prescription to Lisinopril 40 mg.    Sinus infection:  He says that he has had a sinus infection for about a week and a half.  He says he has taken Augmentin before and it did not help.  He says that his face feels swollen and tender.  He denies any fever, chills, sore throat or cough.  Discussed OTC treatments for symptoms.    Relevant past medical, surgical, family and social history reviewed and updated as indicated. Interim medical history since our last visit reviewed. Allergies and medications reviewed and updated.  Review of Systems  Constitutional: Negative for fever or weight change.  HEENT: Positive sinus congestion and facial tenderness Respiratory: Negative for cough and shortness of breath.   Cardiovascular: Negative for chest pain or palpitations.  Gastrointestinal: Negative for abdominal pain, no bowel changes.  Musculoskeletal: Negative for gait problem or joint swelling.  Skin: Negative for rash.  Neurological: Negative for dizziness or headache.  No other specific complaints in a complete review of systems (except as listed in HPI above).      Objective:    BP 128/74   Pulse 90   Temp 98.2 F (36.8 C) (Oral)   Resp 18   Ht 6'  (1.829 m)   Wt 236 lb (107 kg)   SpO2 99%   BMI 32.01 kg/m   Wt Readings from Last 3 Encounters:  12/26/20 236 lb (107 kg)  12/06/20 230 lb 9.6 oz (104.6 kg)  05/16/20 223 lb 14.4 oz (101.6 kg)    Physical Exam  Constitutional: Patient appears well-developed and well-nourished.  No distress.  HEENT: head atraumatic, normocephalic, pupils equal and reactive to light, ears TMs clear, neck supple, throat within normal limits, tenderness noted to frontal and maxillary sinus Cardiovascular: Normal rate, regular rhythm and normal heart sounds.  No murmur heard. No BLE edema. Pulmonary/Chest: Effort normal and breath sounds normal. No respiratory distress. Abdominal: Soft.  There is no tenderness. Psychiatric: Patient has a normal mood and affect. behavior is normal. Judgment and thought content normal.   Results for orders placed or performed in visit on 12/06/20  Hemoglobin A1C  Result Value Ref Range   Hgb A1c MFr Bld 5.8 (H) <5.7 % of total Hgb   Mean Plasma Glucose 120 mg/dL   eAG (mmol/L) 6.6 mmol/L  COMPLETE METABOLIC PANEL WITH GFR  Result Value Ref Range   Glucose, Bld 99 65 - 99 mg/dL   BUN 17 7 - 25 mg/dL   Creat 1.15 0.70 - 1.30 mg/dL   eGFR  78 > OR = 60 mL/min/1.57m   BUN/Creatinine Ratio NOT APPLICABLE 6 - 22 (calc)   Sodium 137 135 - 146 mmol/L   Potassium 4.5 3.5 - 5.3 mmol/L   Chloride 103 98 - 110 mmol/L   CO2 28 20 - 32 mmol/L   Calcium 10.0 8.6 - 10.3 mg/dL   Total Protein 7.4 6.1 - 8.1 g/dL   Albumin 4.7 3.6 - 5.1 g/dL   Globulin 2.7 1.9 - 3.7 g/dL (calc)   AG Ratio 1.7 1.0 - 2.5 (calc)   Total Bilirubin 0.5 0.2 - 1.2 mg/dL   Alkaline phosphatase (APISO) 97 35 - 144 U/L   AST 31 10 - 35 U/L   ALT 39 9 - 46 U/L  HIV antibody (with reflex)  Result Value Ref Range   HIV 1&2 Ab, 4th Generation NON-REACTIVE NON-REACTIVE  Hepatitis C Antibody  Result Value Ref Range   Hepatitis C Ab NON-REACTIVE NON-REACTIVE   SIGNAL TO CUT-OFF 0.06 <1.00  CBC with  Differential/Platelet  Result Value Ref Range   WBC 4.2 3.8 - 10.8 Thousand/uL   RBC 5.01 4.20 - 5.80 Million/uL   Hemoglobin 14.0 13.2 - 17.1 g/dL   HCT 41.8 38.5 - 50.0 %   MCV 83.4 80.0 - 100.0 fL   MCH 27.9 27.0 - 33.0 pg   MCHC 33.5 32.0 - 36.0 g/dL   RDW 13.2 11.0 - 15.0 %   Platelets 209 140 - 400 Thousand/uL   MPV 9.6 7.5 - 12.5 fL   Neutro Abs 2,264 1,500 - 7,800 cells/uL   Lymphs Abs 1,428 850 - 3,900 cells/uL   Absolute Monocytes 365 200 - 950 cells/uL   Eosinophils Absolute 92 15 - 500 cells/uL   Basophils Absolute 50 0 - 200 cells/uL   Neutrophils Relative % 53.9 %   Total Lymphocyte 34.0 %   Monocytes Relative 8.7 %   Eosinophils Relative 2.2 %   Basophils Relative 1.2 %      Assessment & Plan:   1. Essential hypertension  - lisinopril (ZESTRIL) 40 MG tablet; Take 1 tablet (40 mg total) by mouth daily.  Dispense: 90 tablet; Refill: 3  2. Acute recurrent pansinusitis  - doxycycline (VIBRA-TABS) 100 MG tablet; Take 1 tablet (100 mg total) by mouth 2 (two) times daily for 10 days.  Dispense: 20 tablet; Refill: 0  3. Screening for colon cancer  - Ambulatory referral to Gastroenterology   Follow up plan: Return in about 6 months (around 06/25/2021) for follow up.

## 2021-01-03 ENCOUNTER — Telehealth: Payer: Self-pay

## 2021-01-03 NOTE — Telephone Encounter (Signed)
Called patient no answer no way to leave message mailbox is full

## 2021-06-12 ENCOUNTER — Other Ambulatory Visit: Payer: Self-pay

## 2021-06-12 DIAGNOSIS — I679 Cerebrovascular disease, unspecified: Secondary | ICD-10-CM

## 2021-06-12 DIAGNOSIS — E782 Mixed hyperlipidemia: Secondary | ICD-10-CM

## 2021-06-12 DIAGNOSIS — I69354 Hemiplegia and hemiparesis following cerebral infarction affecting left non-dominant side: Secondary | ICD-10-CM

## 2021-06-12 DIAGNOSIS — Z5181 Encounter for therapeutic drug level monitoring: Secondary | ICD-10-CM

## 2021-06-12 MED ORDER — EZETIMIBE 10 MG PO TABS: 10.0000 mg | ORAL_TABLET | Freq: Every day | ORAL | 3 refills | Status: DC

## 2021-07-10 ENCOUNTER — Other Ambulatory Visit: Payer: Self-pay

## 2021-07-10 DIAGNOSIS — Z5181 Encounter for therapeutic drug level monitoring: Secondary | ICD-10-CM

## 2021-07-10 DIAGNOSIS — E782 Mixed hyperlipidemia: Secondary | ICD-10-CM

## 2021-07-10 MED ORDER — ATORVASTATIN CALCIUM 40 MG PO TABS: 40.0000 mg | ORAL_TABLET | Freq: Every day | ORAL | 0 refills | Status: DC

## 2021-07-10 NOTE — Telephone Encounter (Signed)
Pt has been seen on 12/26/2020 but has not had labwork done for lipids within a year as protocol shows. Please advice

## 2021-07-11 NOTE — Telephone Encounter (Signed)
Appt made

## 2021-08-03 ENCOUNTER — Ambulatory Visit: Payer: BC Managed Care – PPO | Admitting: Family Medicine

## 2021-08-11 ENCOUNTER — Ambulatory Visit: Payer: BC Managed Care – PPO | Admitting: Family Medicine

## 2021-08-11 ENCOUNTER — Encounter: Payer: Self-pay | Admitting: Family Medicine

## 2021-08-11 VITALS — BP 128/82 | HR 73 | Temp 98.2°F | Resp 16 | Ht 74.0 in | Wt 227.7 lb

## 2021-08-11 DIAGNOSIS — R7303 Prediabetes: Secondary | ICD-10-CM | POA: Diagnosis not present

## 2021-08-11 DIAGNOSIS — Z5181 Encounter for therapeutic drug level monitoring: Secondary | ICD-10-CM

## 2021-08-11 DIAGNOSIS — E782 Mixed hyperlipidemia: Secondary | ICD-10-CM | POA: Diagnosis not present

## 2021-08-11 DIAGNOSIS — R29818 Other symptoms and signs involving the nervous system: Secondary | ICD-10-CM

## 2021-08-11 DIAGNOSIS — I10 Essential (primary) hypertension: Secondary | ICD-10-CM

## 2021-08-11 DIAGNOSIS — I69354 Hemiplegia and hemiparesis following cerebral infarction affecting left non-dominant side: Secondary | ICD-10-CM

## 2021-08-11 DIAGNOSIS — Z8673 Personal history of transient ischemic attack (TIA), and cerebral infarction without residual deficits: Secondary | ICD-10-CM

## 2021-08-11 DIAGNOSIS — I679 Cerebrovascular disease, unspecified: Secondary | ICD-10-CM

## 2021-08-11 DIAGNOSIS — J310 Chronic rhinitis: Secondary | ICD-10-CM

## 2021-08-11 DIAGNOSIS — Z23 Encounter for immunization: Secondary | ICD-10-CM

## 2021-08-11 DIAGNOSIS — Z7902 Long term (current) use of antithrombotics/antiplatelets: Secondary | ICD-10-CM

## 2021-08-11 MED ORDER — SHINGRIX 50 MCG/0.5ML IM SUSR: 0.5000 mL | Freq: Once | INTRAMUSCULAR | 0 refills | Status: AC

## 2021-08-11 NOTE — Progress Notes (Signed)
Name: Charles Alvarado   MRN: [id number]    DOB: 06/13/1970   Date:08/11/2021       Progress Note  Chief Complaint  Patient presents with   Follow-up   Hypertension   Hyperlipidemia     Subjective:   Charles Alvarado is a 51 y.o. male, presents to clinic for routine f/up  HTN - bp uncontrolled on lisinopril  BP Readings from Last 3 Encounters:  08/11/21 (!) 150/86  12/26/20 128/74  12/06/20 (!) 156/86   Hx of stroke - he went back to specialists once, additional imaging was done - plavix was to be stopped per their note, and pt did not f/up  Hyperlipidemia: Currently treated with atorvastatin 40 mg, pt reports poor med compliance Last Lipids: - Denies: Chest pain, shortness of breath, myalgias, claudication  He is sleeping poorly always tired  Recurrent HA/s sinus issues     Current Outpatient Medications:    amitriptyline (ELAVIL) 25 MG tablet, Take 25 mg by mouth at bedtime., Disp: , Rfl:    aspirin 81 MG chewable tablet, Chew by mouth., Disp: , Rfl:    ezetimibe (ZETIA) 10 MG tablet, Take 1 tablet (10 mg total) by mouth daily., Disp: 90 tablet, Rfl: 3   ID NOW COVID-19 KIT, , Disp: , Rfl:    ipratropium (ATROVENT) 0.03 % nasal spray, Place 2 sprays into both nostrils every 12 (twelve) hours., Disp: 30 mL, Rfl: 12   lisinopril (ZESTRIL) 40 MG tablet, Take 1 tablet (40 mg total) by mouth daily., Disp: 90 tablet, Rfl: 3   mometasone (NASONEX) 50 MCG/ACT nasal spray, Place 2 sprays into the nose daily., Disp: 1 each, Rfl: 12   oxyCODONE-acetaminophen (PERCOCET) 10-325 MG tablet, TK 1 T PO 5 TIMES A DAY, Disp: , Rfl:    atorvastatin (LIPITOR) 40 MG tablet, Take 1 tablet (40 mg total) by mouth daily. (Patient not taking: Reported on 08/11/2021), Disp: 90 tablet, Rfl: 0   ibuprofen (ADVIL) 800 MG tablet, Take 800 mg by mouth 2 (two) times daily., Disp: , Rfl:    levocetirizine (XYZAL) 5 MG tablet, Take 1 tablet (5 mg total) by mouth every evening. (Patient not taking:  Reported on 08/11/2021), Disp: 90 tablet, Rfl: 2   tadalafil (CIALIS) 20 MG tablet, Take 0.5-1 tablets (10-20 mg total) by mouth every other day as needed for erectile dysfunction. (Patient not taking: Reported on 08/11/2021), Disp: 10 tablet, Rfl: 3  Patient Active Problem List   Diagnosis Date Noted   Hemiplegia of left 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 ([place]) 12/10/2018   History of ischemic stroke 12/10/2018   Prediabetes 12/10/2018   Intracranial vascular stenosis 09/13/2018   Heart murmur 02/21/2015   Essential hypertension 02/21/2015   Mixed hyperlipidemia 02/21/2015    Past Surgical History:  Procedure Laterality Date   CLOSED REDUCTION SHOULDER DISLOCATION  06-2015   SHOULDER ARTHROSCOPY WITH OPEN ROTATOR CUFF REPAIR AND DISTAL CLAVICLE ACROMINECTOMY Right 06/16/2015   Procedure: right shoulder arthroscopic repair of labral tear;  Surgeon: Thornton Park, MD;  Location: [facility] ORS;  Service: Orthopedics;  Laterality: Right;    Family History  Problem Relation Age of Onset   Hypertension Mother    Hypertension Father    Diabetes Father    Diabetes Sister    Hypertension Sister     Social History   Tobacco Use   Smoking status: Never   Smokeless tobacco: Never  Vaping Use   Vaping Use: Never used  Substance Use  Topics   Alcohol use: No    Alcohol/week: 0.0 standard drinks of alcohol   Drug use: No     No Known Allergies  Health Maintenance  Topic Date Due   COVID-19 Vaccine (3 - Pfizer series) 03/01/2020   Zoster Vaccines- Shingrix (1 of 2) Never done   COLONOSCOPY (Pts 45-61yr Insurance coverage will need to be confirmed)  12/06/2021 (Originally 08/27/2015)   TETANUS/TDAP  04/24/2025   Hepatitis C Screening  Completed   HIV Screening  Completed   HPV VACCINES  Aged Out   INFLUENZA VACCINE  Discontinued    Chart Review Today: I have reviewed the patient's medical history in detail and updated the computerized patient record.   Review of  Systems  Constitutional: Negative.   HENT: Negative.    Eyes: Negative.   Respiratory: Negative.    Cardiovascular: Negative.   Gastrointestinal: Negative.   Endocrine: Negative.   Genitourinary: Negative.   Musculoskeletal: Negative.   Skin: Negative.   Allergic/Immunologic: Negative.   Neurological: Negative.   Hematological: Negative.   Psychiatric/Behavioral: Negative.    All other systems reviewed and are negative.    Objective:   Vitals:   08/11/21 0917  BP: (!) 150/86  Pulse: 73  Resp: 16  Temp: 98.2 F (36.8 C)  TempSrc: Oral  SpO2: 98%  Weight: 227 lb 11.2 oz (103.3 kg)  Height: 6' 2"  (1.88 m)    Body mass index is 29.23 kg/m.  Physical Exam Vitals and nursing note reviewed.  Constitutional:      General: He is not in acute distress.    Appearance: Normal appearance. He is well-developed. He is not ill-appearing, toxic-appearing or diaphoretic.     Interventions: Face mask in place.  HENT:     Head: Normocephalic and atraumatic.     Jaw: No trismus.     Right Ear: External ear normal.     Left Ear: External ear normal.     Nose: Congestion and rhinorrhea present.     Mouth/Throat:     Pharynx: Oropharynx is clear.  Eyes:     General: Lids are normal. No scleral icterus.       Right eye: No discharge.        Left eye: No discharge.     Conjunctiva/sclera: Conjunctivae normal.     Pupils: Pupils are equal, round, and reactive to light.  Neck:     Trachea: Trachea and phonation normal. No tracheal deviation.  Cardiovascular:     Rate and Rhythm: Normal rate and regular rhythm.     Pulses: Normal pulses.          Radial pulses are 2+ on the right side and 2+ on the left side.       Posterior tibial pulses are 2+ on the right side and 2+ on the left side.     Heart sounds: Normal heart sounds. No murmur heard.    No friction rub. No gallop.  Pulmonary:     Effort: Pulmonary effort is normal. No respiratory distress.     Breath sounds: Normal  breath sounds. No stridor. No wheezing, rhonchi or rales.  Abdominal:     General: Bowel sounds are normal. There is no distension.     Palpations: Abdomen is soft.  Musculoskeletal:     Right lower leg: No edema.     Left lower leg: No edema.  Skin:    General: Skin is warm and dry.     Coloration: Skin is not  jaundiced.     Findings: No rash.     Nails: There is no clubbing.  Neurological:     Mental Status: He is alert. Mental status is at baseline.     Cranial Nerves: No dysarthria or facial asymmetry.     Motor: No tremor or abnormal muscle tone.     Gait: Gait normal.  Psychiatric:        Mood and Affect: Mood normal.        Speech: Speech normal.        Behavior: Behavior normal. Behavior is cooperative.         Assessment & Plan:     ICD-10-CM   1. Essential hypertension  Q77 COMPLETE METABOLIC PANEL WITH GFR    Ambulatory referral to Pulmonology   poorly controlled on lisinopril, we are going to increase dose, close f/up for recheck    2. Mixed hyperlipidemia  J73.6 COMPLETE METABOLIC PANEL WITH GFR    Lipid panel   compliance is unclear, hx of stroke, will recheck labs and adjust meds    3. Prediabetes  K81.59 COMPLETE METABOLIC PANEL WITH GFR    Hemoglobin A1c    4. Encounter for medication monitoring  Z51.81 CBC with Differential/Platelet    COMPLETE METABOLIC PANEL WITH GFR    Lipid panel    Hemoglobin A1c    5. Need for shingles vaccine  Z23 Zoster Vaccine Adjuvanted [facility]) injection    6. Encounter for current long term use of antiplatelet drug  Z79.02    should have D/C plavix and continued ASA     7. History of ischemic stroke  Z86.73    still needs f/up with specialists    8. Intracranial vascular stenosis  I67.9    per specialists     9. Hemiplegia of left 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, unspecified hemiplegia type ([place])  I69.354    on asa, poor statin complicance, recommended going back to specialits     10.  Chronic rhinitis  J31.0    chronic sx, causing HA's, increase med doses, add nasal spray - may need ENT eval    11. Suspected sleep apnea  R29.818 Ambulatory referral to Pulmonology        Acute arterial ischemic stroke, multifocal, mult vascular territories (CMS-HCC)   No follow-ups on file.   Delsa Grana, PA-C 08/11/21 9:30 AM

## 2021-08-11 NOTE — Patient Instructions (Addendum)
Avoid NSAIDs and work on a low salt diet   Sleep medicine should call you   And I think consulting with an ENT would be very worth while   How to Take Your Blood Pressure Blood pressure measures how strongly your blood is pressing against the walls of your arteries. Arteries are blood vessels that carry blood from your heart throughout your body. You can take your blood pressure at home with a machine. You may need to check your blood pressure at home: To check if you have high blood pressure (hypertension). To check your blood pressure over time. To make sure your blood pressure medicine is working. Supplies needed: Blood pressure machine, or monitor. A chair to sit in. This should be a chair where you can sit upright with your back supported. Do not sit on a soft couch or an armchair. Table or desk. Small notebook. Pencil or pen. How to prepare Avoid these things for 30 minutes before checking your blood pressure: Having drinks with caffeine in them, such as coffee or tea. Drinking alcohol. Eating. Smoking. Exercising. Do these things five minutes before checking your blood pressure: Go to the bathroom and pee (urinate). Sit in a chair. Be quiet. Do not talk. How to take your blood pressure Follow the instructions that came with your machine. If you have a digital blood pressure monitor, these may be the instructions: Sit up straight. Place your feet on the floor. Do not cross your ankles or legs. Rest your left arm at the level of your heart. You may rest it on a table, desk, or chair. Pull up your shirt sleeve. Wrap the blood pressure cuff around the upper part of your left arm. The cuff should be 1 inch (2.5 cm) above your elbow. It is best to wrap the cuff around bare skin. Fit the cuff snugly around your arm, but not too tightly. You should be able to place only one finger between the cuff and your arm. Place the cord so that it rests in the bend of your elbow. Press the  power button. Sit quietly while the cuff fills with air and loses air. Write down the numbers on the screen. Wait 2-3 minutes and then repeat steps 1-10. What do the numbers mean? Two numbers make up your blood pressure. The first number is called systolic pressure. The second is called diastolic pressure. An example of a blood pressure reading is "120 over 80" (or 120/80). If you are an adult and do not have a medical condition, use this guide to find out if your blood pressure is normal: Normal First number: below 120. Second number: below 80. Elevated First number: 120-129. Second number: below 80. Hypertension stage 1 First number: 130-139. Second number: 80-89. Hypertension stage 2 First number: 140 or above. Second number: 90 or above. Your blood pressure is above normal even if only the first or only the second number is above normal. Follow these instructions at home: Medicines Take over-the-counter and prescription medicines only as told by your doctor. Tell your doctor if your medicine is causing side effects. General instructions Check your blood pressure as often as your doctor tells you to. Check your blood pressure at the same time every day. Take your monitor to your next doctor's appointment. Your doctor will: Make sure you are using it correctly. Make sure it is working right. Understand what your blood pressure numbers should be. Keep all follow-up visits. General tips You will need a blood pressure machine  or monitor. Your doctor can suggest a monitor. You can buy one at a drugstore or online. When choosing one: Choose one with an arm cuff. Choose one that wraps around your upper arm. Only one finger should fit between your arm and the cuff. Do not choose one that measures your blood pressure from your wrist or finger. Where to find more information American Heart Association: www.heart.org Contact a doctor if: Your blood pressure keeps being high. Your  blood pressure is suddenly low. Get help right away if: Your first blood pressure number is higher than 180. Your second blood pressure number is higher than 120. These symptoms may be an emergency. Do not wait to see if the symptoms will go away. Get help right away. Call 911. Summary Check your blood pressure at the same time every day. Avoid caffeine, alcohol, smoking, and exercise for 30 minutes before checking your blood pressure. Make sure you understand what your blood pressure numbers should be. This information is not intended to replace advice given to you by your health care provider. Make sure you discuss any questions you have with your health care provider. Document Revised: 10/06/2020 Document Reviewed: 10/06/2020 Elsevier Patient Education  2023 Elsevier Inc.  Blood Pressure Record Sheet  Blood pressure log Date: _______________________ a.m. _____________________(1st reading) _____________________(2nd reading) p.m. _____________________(1st reading) _____________________(2nd reading) Date: _______________________ a.m. _____________________(1st reading) _____________________(2nd reading) p.m. _____________________(1st reading) _____________________(2nd reading) Date: _______________________ a.m. _____________________(1st reading) _____________________(2nd reading) p.m. _____________________(1st reading) _____________________(2nd reading) Date: _______________________ a.m. _____________________(1st reading) _____________________(2nd reading) p.m. _____________________(1st reading) _____________________(2nd reading) Date: _______________________ a.m. _____________________(1st reading) _____________________(2nd reading) p.m. _____________________(1st reading) _____________________(2nd reading) Date: _______________________ a.m. _____________________(1st reading) _____________________(2nd reading) p.m. _____________________(1st reading) _____________________(2nd  reading) Date: _______________________ a.m. _____________________(1st reading) _____________________(2nd reading) p.m. _____________________(1st reading) _____________________(2nd reading) Date: _______________________ a.m. _____________________(1st reading) _____________________(2nd reading) p.m. _____________________(1st reading) _____________________(2nd reading) Date: _______________________ a.m. _____________________(1st reading) _____________________(2nd reading) p.m. _____________________(1st reading) _____________________(2nd reading) Date: _______________________ a.m. _____________________(1st reading) _____________________(2nd reading) p.m. _____________________(1st reading) _____________________(2nd reading)

## 2021-08-12 LAB — COMPLETE METABOLIC PANEL WITH GFR
AG Ratio: 1.8 (calc) (ref 1.0–2.5)
ALT: 28 U/L (ref 9–46)
AST: 22 U/L (ref 10–35)
Albumin: 4.6 g/dL (ref 3.6–5.1)
Alkaline phosphatase (APISO): 102 U/L (ref 35–144)
BUN: 14 mg/dL (ref 7–25)
CO2: 27 mmol/L (ref 20–32)
Calcium: 9.6 mg/dL (ref 8.6–10.3)
Chloride: 105 mmol/L (ref 98–110)
Creat: 1.2 mg/dL (ref 0.70–1.30)
Globulin: 2.5 g/dL (calc) (ref 1.9–3.7)
Glucose, Bld: 100 mg/dL — ABNORMAL HIGH (ref 65–99)
Potassium: 4.3 mmol/L (ref 3.5–5.3)
Sodium: 140 mmol/L (ref 135–146)
Total Bilirubin: 0.5 mg/dL (ref 0.2–1.2)
Total Protein: 7.1 g/dL (ref 6.1–8.1)
eGFR: 74 mL/min/{1.73_m2} (ref >=60)

## 2021-08-12 LAB — CBC WITH DIFFERENTIAL/PLATELET
Absolute Monocytes: 373 cells/uL (ref 200–950)
Basophils Absolute: 60 cells/uL (ref 0–200)
Basophils Relative: 1.3 %
Eosinophils Absolute: 101 cells/uL (ref 15–500)
Eosinophils Relative: 2.2 %
HCT: 40.8 % (ref 38.5–50.0)
Hemoglobin: 13.7 g/dL (ref 13.2–17.1)
Lymphs Abs: 1900 cells/uL (ref 850–3900)
MCH: 27.8 pg (ref 27.0–33.0)
MCHC: 33.6 g/dL (ref 32.0–36.0)
MCV: 82.8 fL (ref 80.0–100.0)
MPV: 9.6 fL (ref 7.5–12.5)
Monocytes Relative: 8.1 %
Neutro Abs: 2167 cells/uL (ref 1500–7800)
Neutrophils Relative %: 47.1 %
Platelets: 220 10*3/uL (ref 140–400)
RBC: 4.93 10*6/uL (ref 4.20–5.80)
RDW: 12.8 % (ref 11.0–15.0)
Total Lymphocyte: 41.3 %
WBC: 4.6 10*3/uL (ref 3.8–10.8)

## 2021-08-12 LAB — HEMOGLOBIN A1C
Hgb A1c MFr Bld: 5.8 % of total Hgb — ABNORMAL HIGH (ref <5.7)
Mean Plasma Glucose: 120 mg/dL
eAG (mmol/L): 6.6 mmol/L

## 2021-08-12 LAB — LIPID PANEL
Cholesterol: 121 mg/dL (ref <200)
HDL: 32 mg/dL — ABNORMAL LOW (ref >=40)
LDL Cholesterol (Calc): 75 mg/dL (calc)
Non-HDL Cholesterol (Calc): 89 mg/dL (calc) (ref <130)
Total CHOL/HDL Ratio: 3.8 (calc) (ref <5.0)
Triglycerides: 62 mg/dL (ref <150)

## 2021-08-15 ENCOUNTER — Other Ambulatory Visit: Payer: Self-pay

## 2021-08-15 ENCOUNTER — Encounter: Payer: Self-pay | Admitting: Emergency Medicine

## 2021-08-15 ENCOUNTER — Ambulatory Visit
Admission: EM | Admit: 2021-08-15 | Discharge: 2021-08-15 | Disposition: A | Discharge status: Home / Self Care | Payer: BC Managed Care – PPO | Attending: Internal Medicine | Admitting: Internal Medicine

## 2021-08-15 DIAGNOSIS — L239 Allergic contact dermatitis, unspecified cause: Secondary | ICD-10-CM

## 2021-08-15 DIAGNOSIS — R21 Rash and other nonspecific skin eruption: Secondary | ICD-10-CM | POA: Diagnosis not present

## 2021-08-15 MED ORDER — FAMOTIDINE 20 MG PO TABS: 20.0000 mg | ORAL_TABLET | Freq: Two times a day (BID) | ORAL | 0 refills | Status: DC

## 2021-08-15 MED ORDER — PREDNISONE 20 MG PO TABS: 40.0000 mg | ORAL_TABLET | Freq: Every day | ORAL | 0 refills | Status: AC

## 2021-08-15 MED ORDER — CETIRIZINE HCL 10 MG PO TABS: 10.0000 mg | ORAL_TABLET | Freq: Every day | ORAL | 0 refills | Status: DC

## 2021-08-15 NOTE — ED Triage Notes (Signed)
Pt here for rash to arms x 3 days that he thinks is poison ivy

## 2021-08-15 NOTE — Discharge Instructions (Signed)
You have been prescribed 3 medications to alleviate symptoms.  Please follow-up if symptoms persist or worsen. 

## 2021-08-15 NOTE — ED Provider Notes (Signed)
[facility]    CSN: [id number] Arrival date & time: 08/15/21  1757      History   Chief Complaint Chief Complaint  Patient presents with   Rash    HPI Charles Alvarado is a 51 y.o. male.   Patient presents with itchy rash to bilateral arms and face that started about 3 days ago.  Patient was working in the yard and thinks he may have been exposed to poison ivy.  Denies any other changes in the environments or associated fevers.  Patient has tried hydrocortisone with no improvement.  Patient is concerned as he has had more severe skin reaction to poison ivy in the past and does not want it to progress.   Rash   Past Medical History:  Diagnosis Date   Anemia    H/O AS A CHILD   Blood clots in brain    Claustrophobia    Heart murmur    ASYMPTOMATIC-RECENTLY DIAGNOSED 3 MONTHS  AGO(03-2015) BY PCP   Hyperlipidemia    Hypertension    Stroke [facility])     Patient Active Problem List   Diagnosis Date Noted   Hemiplegia of left 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 ([place]) 12/10/2018   History of ischemic stroke 12/10/2018   Prediabetes 12/10/2018   Intracranial vascular stenosis 09/13/2018   Heart murmur 02/21/2015   Essential hypertension 02/21/2015   Mixed hyperlipidemia 02/21/2015    Past Surgical History:  Procedure Laterality Date   CLOSED REDUCTION SHOULDER DISLOCATION  06-2015   SHOULDER ARTHROSCOPY WITH OPEN ROTATOR CUFF REPAIR AND DISTAL CLAVICLE ACROMINECTOMY Right 06/16/2015   Procedure: right shoulder arthroscopic repair of labral tear;  Surgeon: Thornton Park, MD;  Location: [facility] ORS;  Service: Orthopedics;  Laterality: Right;       Home Medications    Prior to Admission medications   Medication Sig Start Date End Date Taking? Authorizing Provider  cetirizine (ZYRTEC) 10 MG tablet Take 1 tablet (10 mg total) by mouth daily for 10 days. 08/15/21 08/25/21 Yes [place], Michele Rockers, FNP  famotidine (PEPCID) 20 MG tablet Take 1  tablet (20 mg total) by mouth 2 (two) times daily. 08/15/21  Yes [place], Hildred Alamin E, FNP  predniSONE (DELTASONE) 20 MG tablet Take 2 tablets (40 mg total) by mouth daily for 5 days. 08/15/21 08/20/21 Yes [place], Michele Rockers, FNP  amitriptyline (ELAVIL) 25 MG tablet Take 25 mg by mouth at bedtime. 12/01/20   [provider]  aspirin 81 MG chewable tablet Chew by mouth. 09/14/18   [provider]  atorvastatin (LIPITOR) 40 MG tablet Take 1 tablet (40 mg total) by mouth daily. Patient not taking: Reported on 08/11/2021 07/10/21   Delsa Grana, PA-C  ezetimibe (ZETIA) 10 MG tablet Take 1 tablet (10 mg total) by mouth daily. 06/12/21   Delsa Grana, PA-C  ID NOW COVID-19 KIT  10/12/20   [provider]  ipratropium (ATROVENT) 0.03 % nasal spray Place 2 sprays into both nostrils every 12 (twelve) hours. 05/16/20   Delsa Grana, PA-C  levocetirizine (XYZAL) 5 MG tablet Take 1 tablet (5 mg total) by mouth every evening. Patient not taking: Reported on 08/11/2021 05/16/20   Delsa Grana, PA-C  lisinopril (ZESTRIL) 40 MG tablet Take 1 tablet (40 mg total) by mouth daily. 12/26/20   Bo Merino, FNP  mometasone (NASONEX) 50 MCG/ACT nasal spray Place 2 sprays into the nose daily. 11/16/19   Delsa Grana, PA-C  oxyCODONE-acetaminophen (PERCOCET) 10-325 MG tablet TK 1 T  PO 5 TIMES A DAY 10/11/18   [provider]  tadalafil (CIALIS) 20 MG tablet Take 0.5-1 tablets (10-20 mg total) by mouth every other day as needed for erectile dysfunction. Patient not taking: Reported on 08/11/2021 02/24/19   Hubbard Hartshorn, FNP    Family History Family History  Problem Relation Age of Onset   Hypertension Mother    Hypertension Father    Diabetes Father    Diabetes Sister    Hypertension Sister     Social History Social History   Tobacco Use   Smoking status: Never   Smokeless tobacco: Never  Vaping Use   Vaping Use: Never used  Substance Use Topics   Alcohol use: No    Alcohol/week: 0.0 standard  drinks of alcohol   Drug use: No     Allergies   Patient has no known allergies.   Review of Systems Review of Systems Per HPI  Physical Exam Triage Vital Signs ED Triage Vitals  Enc Vitals Group     BP 08/15/21 1810 (!) 156/87     Pulse Rate 08/15/21 1810 97     Resp 08/15/21 1810 18     Temp 08/15/21 1810 (!) 97.4 F (36.3 C)     Temp Source 08/15/21 1810 Oral     SpO2 08/15/21 1810 97 %     Weight --      Height --      Head Circumference --      Peak Flow --      Pain Score 08/15/21 1811 0     Pain Loc --      Pain Edu? --      Excl. in [place]? --    No data found.  Updated Vital Signs BP (!) 156/87 (BP Location: Left Arm)   Pulse 97   Temp (!) 97.4 F (36.3 C) (Oral)   Resp 18   SpO2 97%   Visual Acuity Right Eye Distance:   Left Eye Distance:   Bilateral Distance:    Right Eye Near:   Left Eye Near:    Bilateral Near:     Physical Exam Constitutional:      General: He is not in acute distress.    Appearance: Normal appearance. He is not toxic-appearing or diaphoretic.  HENT:     Head: Normocephalic and atraumatic.  Eyes:     Extraocular Movements: Extraocular movements intact.     Conjunctiva/sclera: Conjunctivae normal.  Pulmonary:     Effort: Pulmonary effort is normal.  Skin:    Comments: Scattered areas of maculopapular rash present to bilateral arms and bilateral cheeks of face.  No purulent drainage noted.  Neurological:     General: No focal deficit present.     Mental Status: He is alert and oriented to person, place, and time. Mental status is at baseline.  Psychiatric:        Mood and Affect: Mood normal.        Behavior: Behavior normal.        Thought Content: Thought content normal.        Judgment: Judgment normal.      UC Treatments / Results  Labs (all labs ordered are listed, but only abnormal results are displayed) Labs Reviewed - No data to display  EKG   Radiology No results found.  Procedures Procedures  (including critical care time)  Medications Ordered in UC Medications - No data to display  Initial Impression / Assessment and Plan / UC  Course  I have reviewed the triage vital signs and the nursing notes.  Pertinent labs & imaging results that were available during my care of the patient were reviewed by me and considered in my medical decision making (see chart for details).     Rash is consistent with contact dermatitis. Due to facial involvement, will treat with prednisone steroid.  Will add antihistamine and Pepcid as well.  Patient states that he does not take xyzal anymore so cetirizine should be safe. Patient advised to follow-up if symptoms persist or worsen.  Patient verbalized understanding and was agreeable with plan. Final Clinical Impressions(s) / UC Diagnoses   Final diagnoses:  Rash and nonspecific skin eruption  Allergic contact dermatitis, unspecified trigger     Discharge Instructions      You have been prescribed 3 medications to alleviate symptoms.  Please follow-up if symptoms persist or worsen.     ED Prescriptions     Medication Sig Dispense Auth. Provider   cetirizine (ZYRTEC) 10 MG tablet Take 1 tablet (10 mg total) by mouth daily for 10 days. 30 tablet [place], [place] E, [place]   famotidine (PEPCID) 20 MG tablet Take 1 tablet (20 mg total) by mouth 2 (two) times daily. 30 tablet [place], [place] E, [place]   predniSONE (DELTASONE) 20 MG tablet Take 2 tablets (40 mg total) by mouth daily for 5 days. 10 tablet Teodora Medici, [place]      PDMP not reviewed this encounter.   Teodora Medici, [place] 08/15/21 [phone]

## 2021-10-07 ENCOUNTER — Other Ambulatory Visit: Payer: Self-pay | Admitting: Family Medicine

## 2021-10-07 DIAGNOSIS — E782 Mixed hyperlipidemia: Secondary | ICD-10-CM

## 2021-10-07 DIAGNOSIS — Z5181 Encounter for therapeutic drug level monitoring: Secondary | ICD-10-CM

## 2021-10-10 ENCOUNTER — Other Ambulatory Visit: Payer: Self-pay

## 2021-11-02 ENCOUNTER — Telehealth: Payer: BLUE CROSS/BLUE SHIELD | Admitting: Family Medicine

## 2021-11-02 DIAGNOSIS — B9689 Other specified bacterial agents as the cause of diseases classified elsewhere: Secondary | ICD-10-CM

## 2021-11-02 DIAGNOSIS — J019 Acute sinusitis, unspecified: Secondary | ICD-10-CM

## 2021-11-02 MED ORDER — AMOXICILLIN-POT CLAVULANATE 875-125 MG PO TABS: 1.0000 | ORAL_TABLET | Freq: Two times a day (BID) | ORAL | 0 refills | Status: DC

## 2021-11-02 NOTE — Progress Notes (Signed)
I have spent 5 minutes in review of e-visit questionnaire, review and updating patient chart, medical decision making and response to patient.   Aitana Burry Cody Crissy Mccreadie, PA-C    

## 2021-11-02 NOTE — Progress Notes (Signed)

## 2021-11-10 ENCOUNTER — Other Ambulatory Visit: Payer: Self-pay | Admitting: Family Medicine

## 2021-11-10 DIAGNOSIS — Z5181 Encounter for therapeutic drug level monitoring: Secondary | ICD-10-CM

## 2021-11-10 DIAGNOSIS — E782 Mixed hyperlipidemia: Secondary | ICD-10-CM

## 2021-11-24 ENCOUNTER — Telehealth: Payer: BLUE CROSS/BLUE SHIELD | Admitting: Emergency Medicine

## 2021-11-24 DIAGNOSIS — J029 Acute pharyngitis, unspecified: Secondary | ICD-10-CM | POA: Diagnosis not present

## 2021-11-24 NOTE — Progress Notes (Signed)
E-Visit for Sore Throat  We are sorry that you are not feeling well.  Here is how we plan to help!  Your symptoms indicate a likely viral infection (Pharyngitis).   Pharyngitis is inflammation in the back of the throat which can cause a sore throat, scratchiness and sometimes difficulty swallowing.   Pharyngitis is typically caused by a respiratory virus and will just run its course.  Please keep in mind that your symptoms could last up to 10 days.  For throat pain, we recommend over the counter oral pain relief medications such as acetaminophen or aspirin, or anti-inflammatory medications such as ibuprofen or naproxen sodium.  Topical treatments such as oral throat lozenges or sprays may be used as needed.  Avoid close contact with loved ones, especially the very young and elderly.  Remember to wash your hands thoroughly throughout the day as this is the number one way to prevent the spread of infection and wipe down door knobs and counters with disinfectant.  After careful review of your answers, I would not recommend an antibiotic for your condition.  Antibiotics should not be used to treat conditions that we suspect are caused by viruses like the virus that causes the common cold or flu. However, some people can have Strep with atypical symptoms. You may need formal testing in clinic or office to confirm if your symptoms continue or worsen.  Providers prescribe antibiotics to treat infections caused by bacteria. Antibiotics are very powerful in treating bacterial infections when they are used properly.  To maintain their effectiveness, they should be used only when necessary.  Overuse of antibiotics has resulted in the development of super bugs that are resistant to treatment!    Home Care: Only take medications as instructed by your medical team. Do not drink alcohol while taking these medications. A steam or ultrasonic humidifier can help congestion.  You can place a towel over your head and  breathe in the steam from hot water coming from a faucet. Avoid close contacts especially the very young and the elderly. Cover your mouth when you cough or sneeze. Always remember to wash your hands.  Get Help Right Away If: You develop worsening fever or throat pain. You develop a severe head ache or visual changes. Your symptoms persist after you have completed your treatment plan.  Make sure you Understand these instructions. Will watch your condition. Will get help right away if you are not doing well or get worse.   Thank you for choosing an e-visit.  Your e-visit answers were reviewed by a board certified advanced clinical practitioner to complete your personal care plan. Depending upon the condition, your plan could have included both over the counter or prescription medications.  Please review your pharmacy choice. Make sure the pharmacy is open so you can pick up prescription now. If there is a problem, you may contact your provider through MyChart messaging and have the prescription routed to another pharmacy.  Your safety is important to us. If you have drug allergies check your prescription carefully.   For the next 24 hours you can use MyChart to ask questions about today's visit, request a non-urgent call back, or ask for a work or school excuse. You will get an email in the next two days asking about your experience. I hope that your e-visit has been valuable and will speed your recovery.  I have spent 5 minutes in review of e-visit questionnaire, review and updating patient chart, medical decision making and response   to patient.   Benford Asch, PhD, FNP-BC    

## 2022-01-06 ENCOUNTER — Other Ambulatory Visit: Payer: Self-pay | Admitting: Nurse Practitioner

## 2022-01-06 DIAGNOSIS — I10 Essential (primary) hypertension: Secondary | ICD-10-CM

## 2022-01-08 NOTE — Telephone Encounter (Signed)
Requested Prescriptions  Pending Prescriptions Disp Refills   lisinopril (ZESTRIL) 40 MG tablet [Pharmacy Med Name: LISINOPRIL 40MG  TABLETS] 90 tablet 3    Sig: TAKE 1 TABLET(40 MG) BY MOUTH DAILY     Cardiovascular:  ACE Inhibitors Failed - 01/06/2022  3:29 AM      Failed - Last BP in normal range    BP Readings from Last 1 Encounters:  08/15/21 (!) 156/87         Passed - Cr in normal range and within 180 days    Creat  Date Value Ref Range Status  08/11/2021 1.20 0.70 - 1.30 mg/dL Final         Passed - K in normal range and within 180 days    Potassium  Date Value Ref Range Status  08/11/2021 4.3 3.5 - 5.3 mmol/L Final         Passed - Patient is not pregnant      Passed - Valid encounter within last 6 months    Recent Outpatient Visits           5 months ago Essential hypertension   [facility], PA-C   1 year ago Essential hypertension   [facility], FNP   1 year ago Essential hypertension   [facility], PA-C   1 year ago Mixed hyperlipidemia   [facility], PA-C   2 years ago Essential hypertension   [facility], PA-C       Future Appointments             In 1 month Danelle Berry, PA-C [facility]

## 2022-02-05 ENCOUNTER — Telehealth: Payer: Self-pay | Admitting: Family Medicine

## 2022-02-05 DIAGNOSIS — Z5181 Encounter for therapeutic drug level monitoring: Secondary | ICD-10-CM

## 2022-02-05 DIAGNOSIS — E782 Mixed hyperlipidemia: Secondary | ICD-10-CM

## 2022-02-12 ENCOUNTER — Ambulatory Visit: Payer: BC Managed Care – PPO | Admitting: Family Medicine

## 2022-02-12 ENCOUNTER — Encounter: Payer: Self-pay | Admitting: Family Medicine

## 2022-02-12 VITALS — BP 150/88 | HR 98 | Temp 98.4°F | Resp 16 | Ht 74.0 in | Wt 227.8 lb

## 2022-02-12 DIAGNOSIS — J0191 Acute recurrent sinusitis, unspecified: Secondary | ICD-10-CM | POA: Insufficient documentation

## 2022-02-12 DIAGNOSIS — Z5181 Encounter for therapeutic drug level monitoring: Secondary | ICD-10-CM | POA: Diagnosis not present

## 2022-02-12 DIAGNOSIS — I679 Cerebrovascular disease, unspecified: Secondary | ICD-10-CM

## 2022-02-12 DIAGNOSIS — Z1211 Encounter for screening for malignant neoplasm of colon: Secondary | ICD-10-CM

## 2022-02-12 DIAGNOSIS — I69354 Hemiplegia and hemiparesis following cerebral infarction affecting left non-dominant side: Secondary | ICD-10-CM

## 2022-02-12 DIAGNOSIS — Z8673 Personal history of transient ischemic attack (TIA), and cerebral infarction without residual deficits: Secondary | ICD-10-CM

## 2022-02-12 DIAGNOSIS — J302 Other seasonal allergic rhinitis: Secondary | ICD-10-CM

## 2022-02-12 DIAGNOSIS — R29818 Other symptoms and signs involving the nervous system: Secondary | ICD-10-CM

## 2022-02-12 DIAGNOSIS — F1120 Opioid dependence, uncomplicated: Secondary | ICD-10-CM

## 2022-02-12 DIAGNOSIS — R7303 Prediabetes: Secondary | ICD-10-CM | POA: Diagnosis not present

## 2022-02-12 DIAGNOSIS — Z7902 Long term (current) use of antithrombotics/antiplatelets: Secondary | ICD-10-CM

## 2022-02-12 DIAGNOSIS — E782 Mixed hyperlipidemia: Secondary | ICD-10-CM

## 2022-02-12 DIAGNOSIS — I10 Essential (primary) hypertension: Secondary | ICD-10-CM

## 2022-02-12 DIAGNOSIS — J329 Chronic sinusitis, unspecified: Secondary | ICD-10-CM | POA: Insufficient documentation

## 2022-02-12 DIAGNOSIS — R011 Cardiac murmur, unspecified: Secondary | ICD-10-CM

## 2022-02-12 DIAGNOSIS — I999 Unspecified disorder of circulatory system: Secondary | ICD-10-CM

## 2022-02-12 MED ORDER — LEVOCETIRIZINE DIHYDROCHLORIDE 5 MG PO TABS: 5.0000 mg | ORAL_TABLET | Freq: Every evening | ORAL | 2 refills | Status: DC

## 2022-02-12 MED ORDER — AMLODIPINE BESYLATE 5 MG PO TABS: 5.0000 mg | ORAL_TABLET | Freq: Every day | ORAL | 0 refills | Status: DC

## 2022-02-12 MED ORDER — MOMETASONE FUROATE 50 MCG/ACT NA SUSP: 2.0000 | Freq: Every day | NASAL | 12 refills | Status: DC

## 2022-02-12 MED ORDER — MONTELUKAST SODIUM 10 MG PO TABS: 10.0000 mg | ORAL_TABLET | Freq: Every day | ORAL | 1 refills | Status: AC

## 2022-02-12 NOTE — Assessment & Plan Note (Signed)
See extensive note and plan above with intracranial vascular stenosis -needs to reestablish with neurology and do the follow-up labs and imaging

## 2022-02-12 NOTE — Assessment & Plan Note (Signed)
On pain management -taking Percocet 10 mg 4 times a day-per specialist Controlled substance database/PDMP reviewed today

## 2022-02-12 NOTE — Assessment & Plan Note (Signed)
Prior neurology team at [facility] did stop Plavix but was still in the middle of a workup, again encouraged patient to get back with [facility] specialist or get established with local specialist he states that it is difficult to get to the phone when called for referrals because of working second shift He did give [place] permission to put his wife's phone number on the referrals -Levada Dy

## 2022-02-12 NOTE — Assessment & Plan Note (Addendum)
Multiple attempts to help him get back with specialists team at [facility] and pt has not f/up over the past 2-3 years Info for [facility] will be sent to him again, he can establish locally, but given his hx and particular case it would be best to see prior team, last OV 02/2021, but again lost to f/up and he did not complete labs or imaging

## 2022-02-12 NOTE — Assessment & Plan Note (Addendum)
Uncontrolled on lisinopril after multiple dose increases BP Readings from Last 3 Encounters:  02/12/22 (!) 158/88  08/15/21 (!) 156/87  08/11/21 128/82  Hx of stroke Add amlodipine 5 mg to increase, nurse BP check - plan to increase to 10 mg if not at goal when rechecked Goal to have BP <130/80 Work on DASH/diet/lifestyle

## 2022-02-12 NOTE — Assessment & Plan Note (Signed)
If BP is difficult to get well controlled and pt has hx of murmur and stroke w/o prior ECHO or cardiology - we should get him established locally Pt is not currently having orthopnea, PND, lower extremity edema, palpitations, exertional chest pain, pressure shortness of breath, near-syncope

## 2022-02-12 NOTE — Assessment & Plan Note (Signed)
encouraged him to remain on daily antihistamine meds and nose sprays Encouraged continued med compliance for chronic symptom management follow-up with ENT

## 2022-02-12 NOTE — Assessment & Plan Note (Signed)
Lab Results  Component Value Date   HGBA1C 5.8 (H) 08/11/2021  Last several labs have been stable, will recheck and continue to monitor 1-2 x a year

## 2022-02-12 NOTE — Assessment & Plan Note (Signed)
Pending work up with neuro team at [facility]

## 2022-02-12 NOTE — Progress Notes (Signed)
Name: Charles Alvarado   MRN: [id number]    DOB: [birth year]   Date:02/12/2022       Progress Note  Chief Complaint  Patient presents with   Follow-up   Hypertension   Hyperlipidemia   PreDM     Subjective:   Charles Alvarado is a 52 y.o. male, presents to clinic for routine f/up  HTN - bp uncontrolled previously only on lisinopril - dose was increased gradually, now taking 40 mg daily- BP worse today - pending recheck, after recheck improved to 150/88 BP Readings from Last 3 Encounters:  02/12/22 (!) 170/92  08/15/21 (!) 156/87  08/11/21 128/82  Pt denies CP, SOB, exertional sx, LE edema, palpitation, Ha's, visual disturbances, lightheadedness, hypotension, syncope. Dietary efforts for BP?  Working on diet  Hx of stroke - he went back to specialists once, additional imaging was done - plavix was to be stopped per their note, and pt did not f/up - I have tried to refer him back to them several times - [facility] neuro  Hyperlipidemia: Currently treated with atorvastatin 40 mg and zetia Last labs show LDL was near goal Last Lipids: Lab Results  Component Value Date   CHOL 121 08/11/2021   HDL 32 (L) 08/11/2021   LDLCALC 75 08/11/2021   TRIG 62 08/11/2021   CHOLHDL 3.8 08/11/2021   - Denies: Chest pain, shortness of breath, myalgias, claudication  Prediabetes: Not on meds: Lab Results  Component Value Date   HGBA1C 5.8 (H) 08/11/2021   HGBA1C 5.8 (H) 12/06/2020   HGBA1C 6.0 (H) 05/16/2020   Fatigue/sleepiness - referred to sleep medicine - did not connect with them - unchanged since prior OV  Prior chart review related to stroke:  Stroke/stenosis workup extensive -  late 2020 - f/up with [facility] -    Pt consulted with mutliple Mds in neuro department Charles Alvarado, [name], [name]    [facility] VIR NEUROLOGY [place]   [address]   [place], [place] [address]   [phone] HPI: Mr. Charles Alvarado is a 52 y.o.male with a PMHx of HTN  seen in consultation at the [facility] at the request of Dr. Eli Phillips for evaluation of ischemic stroke. They are requesting my opinion regarding potential etiologies, diagnostic work-up that should be considered, and/or treatment options entertained.   To review prior history, on 09/10/2018 patient first experienced TIA like symptoms at home with LLE weakness/numbness lasting 1 hour followed by full resolution. 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 By discharge on 8/8 his LUE strength was back to baseline but LLE weakness and LUE/LLE numbness persisted. He has a h/o HTN and takes lisinopril 20 mg daily. CTA on 8/14 significant for congenitally absent left ICA, Left A1 nonvisualized, Left MCA fed via an enlarged left PCA.There is high-grade stenosis of the mid and distal M1 segment of the right MCA. Mild focal narrowing of proximal right M2 MCA. There is decreased caliber of the M1 segment of the left MCA without significant superimposed stenosis. There is high-grade narrowing of the A2 segment of the left anterior cerebral artery (13:31). Vasculitis  work-up included normal ANA and slightly elevated ANCA (nonspecific). CRP and sed rate also WNL. Thalassemia profile was negative. He was discharged home on 8/8 with ASA, and statin as well as PLAVIX oer the SAMMPRIS trial.   At his 10/24/2018 visit patient reports continued balance issues as well as mental slowing. He is doing physical therapy once a week (ordered by his PCP) but feels he is only at 65% of his baseline. When he stands he states he has to move his  feet to balance himself so he doesn't fall- denies any dizziness with standing. He feels both of his legs are weak- when seen in the hospital he felt his R side was stronger, but now feels both of his legs are equal in weakness. He feels this started about three weeks ago when he felt a tingle down his right side (in fingers and foot) and says after that he felt both sides were weak. He did not go to the ED because he didn't think it was necessary at the time as his head didn't hurt and he had no pain. He does not use a cane or a walker. He is also endorsing problems with upper extremity weakness- for example he can't open a bag of chips as well anymore (worse on L, but does feel the R side got worse as well). Endorsing some frustration but denies depression. No changes in sensation, chest pain or SOB. But does endorse increased sweating when walking and working with PT.  More than a year ago at f/up appt - HTN was similarly uncontrolled, he needed the neuro f/up: was pending multidiscipline meeting re his case - vascular specialists/neuro [facility]   2023 there were a few [facility] encounters and attempts to order f/up imaging but they were not completed  :Acute arterial ischemic stroke, multifocal, mult vascular territories (CMS-HCC)  MRI Brain W Wo Contrast  Sarita Bottom, MD  [address]  [place], [place] [address]  [phone] (Work)  [phone] (Fax)  Neurology f/up early 2023:  Outpatient Neurology Consult Note  [facility] [address] [place] [address]  Date: 02/10/2021 Patient Name: Charles Alvarado MRN: [id number] PCP: [facility]  Assessment and Plan  [name] is a 52 y.o. male w/ pmhx of HTN, R frontal and parietal stroke presenting in consultation for follow up of ischemic stroke and new onset of persistent left sided numbness and  tingling. Patient lost to follow up since 10/2018. Since last visit patient reports new onset of numbness and tingling on left side that has not resolved. He has a significant hx of vascular disease. Initial stroke on 09/2018 in the setting of left sided numbness and tingling. Workup was remarkable for R parietal and frontal stroke, multifocal stenosis in anterior and posterior circulation as well as congenital absent L ICA. Vasculitis workup negative. LP attempted but unsuccessful. Patient treated with ASA and atorvastatin as well as PLAVIX per SAMMPRIS Trial. His current symptoms could be sequelae of known ischemic stroke but given new onset of persistence of symptoms will plan on obtaining MRI brain wwo contrast. He continues to be on DAPT despite recommendations to discontinue, thus discussed with patient plan to discontinue PLAVIX today and to stay on single agent (ASA). Although he has risk factors for stroke, given age and severity of stenosis IC will repeat vasculitis workup and expand to include APL panel. Will  also repeat MRA head with vessel wall imaging. Can continue taking aspirin and atorvastati 40 (given myalgias with 80mg ). If worsening stenosis or new stroke noted on MRI will consider repeating LP with fluoro and referral to Stroke Clinic. Plan to follow up in 4 months  -Ok to continue atorvastatin 40mg  given myalgias with 80mg  - Discontinue Plavix NOW. Attending addendum: and stay on single agent (ASA) 81 mg/day. - Obtain MRI Brain wwo contrast and MRA head wwo contrast, pt will reach out when MRI are schedule for PRN ativan 2/2 claustrophobia. - Vasculitis workup: ANA, ANCA, ENA, ESR, CRP, APL panel, RF Return Visit in: 4 months per patient request.  I personally spent 80 minutes face-to-face and non-face-to-face in the care of this patient, which includes all pre, intra, and post visit time on the date of service.  Patient was discussed with Dr.Dujmovic who agrees with assessment and  plan.  , MD PGY-IV Neurology Resident      Current Outpatient Medications:    amitriptyline (ELAVIL) 25 MG tablet, Take 25 mg by mouth at bedtime., Disp: , Rfl:    aspirin 81 MG chewable tablet, Chew by mouth., Disp: , Rfl:    atorvastatin (LIPITOR) 40 MG tablet, TAKE 1 TABLET(40 MG) BY MOUTH DAILY, Disp: 90 tablet, Rfl: 0   ezetimibe (ZETIA) 10 MG tablet, Take 1 tablet (10 mg total) by mouth daily., Disp: 90 tablet, Rfl: 3   ibuprofen (ADVIL) 800 MG tablet, Take 800 mg by mouth 2 (two) times daily., Disp: , Rfl:    ID NOW COVID-19 KIT, , Disp: , Rfl:    lisinopril (ZESTRIL) 40 MG tablet, TAKE 1 TABLET(40 MG) BY MOUTH DAILY, Disp: 90 tablet, Rfl: 0   oxyCODONE-acetaminophen (PERCOCET) 10-325 MG tablet, TK 1 T PO 5 TIMES A DAY, Disp: , Rfl:    amoxicillin-clavulanate (AUGMENTIN) 875-125 MG tablet, Take 1 tablet by mouth 2 (two) times daily. (Patient not taking: Reported on 02/12/2022), Disp: 14 tablet, Rfl: 0   cetirizine (ZYRTEC) 10 MG tablet, Take 1 tablet (10 mg total) by mouth daily for 10 days., Disp: 30 tablet, Rfl: 0   famotidine (PEPCID) 20 MG tablet, Take 1 tablet (20 mg total) by mouth 2 (two) times daily. (Patient not taking: Reported on 02/12/2022), Disp: 30 tablet, Rfl: 0   ipratropium (ATROVENT) 0.03 % nasal spray, Place 2 sprays into both nostrils every 12 (twelve) hours. (Patient not taking: Reported on 02/12/2022), Disp: 30 mL, Rfl: 12   levocetirizine (XYZAL) 5 MG tablet, Take 1 tablet (5 mg total) by mouth every evening. (Patient not taking: Reported on 02/12/2022), Disp: 90 tablet, Rfl: 2   mometasone (NASONEX) 50 MCG/ACT nasal spray, Place 2 sprays into the nose daily. (Patient not taking: Reported on 02/12/2022), Disp: 1 each, Rfl: 12   tadalafil (CIALIS) 20 MG tablet, Take 0.5-1 tablets (10-20 mg total) by mouth every other day as needed for erectile dysfunction. (Patient not taking: Reported on 02/12/2022), Disp: 10 tablet, Rfl: 3  Patient Active Problem List    Diagnosis Date Noted   Hemiplegia of left 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 (HCC) 12/10/2018   History of ischemic stroke 12/10/2018   Prediabetes 12/10/2018   Intracranial vascular stenosis 09/13/2018   Heart murmur 02/21/2015   Essential hypertension 02/21/2015   Mixed hyperlipidemia 02/21/2015    Past Surgical History:  Procedure Laterality Date   CLOSED REDUCTION SHOULDER DISLOCATION  06-2015   SHOULDER ARTHROSCOPY WITH OPEN ROTATOR CUFF REPAIR AND DISTAL CLAVICLE ACROMINECTOMY  Right 06/16/2015   Procedure: right shoulder arthroscopic repair of labral tear;  Surgeon: Juanell FairlyKevin Krasinski, MD;  Location: [facility] ORS;  Service: Orthopedics;  Laterality: Right;    Family History  Problem Relation Age of Onset   Hypertension Mother    Hypertension Father    Diabetes Father    Diabetes Sister    Hypertension Sister     Social History   Tobacco Use   Smoking status: Never   Smokeless tobacco: Never  Vaping Use   Vaping Use: Never used  Substance Use Topics   Alcohol use: No    Alcohol/week: 0.0 standard drinks of alcohol   Drug use: No     No Known Allergies  Health Maintenance  Topic Date Due   COLONOSCOPY (Pts 45-1357yrs Insurance coverage will need to be confirmed)  Never done   COVID-19 Vaccine (3 - 2023-24 season) 02/28/2022 (Originally 10/06/2021)   Zoster Vaccines- Shingrix (1 of 2) 05/14/2022 (Originally 08/26/2020)   DTaP/Tdap/Td (2 - Td or Tdap) 04/24/2025   Hepatitis C Screening  Completed   HIV Screening  Completed   HPV VACCINES  Aged Out   INFLUENZA VACCINE  Discontinued    Chart Review Today: I have reviewed the patient's medical history in detail and updated the computerized patient record.   Review of Systems  Constitutional: Negative.   HENT: Negative.    Eyes: Negative.   Respiratory: Negative.    Cardiovascular: Negative.   Gastrointestinal: Negative.   Endocrine: Negative.   Genitourinary: Negative.   Musculoskeletal:  Negative.   Skin: Negative.   Allergic/Immunologic: Negative.   Neurological: Negative.   Hematological: Negative.   Psychiatric/Behavioral: Negative.    All other systems reviewed and are negative.    Objective:   Vitals:   02/12/22 1019 02/12/22 1034 02/12/22 1111  BP: (!) 170/92 (!) 158/88 (!) 150/88  Pulse: 98    Resp: 16    Temp: 98.4 F (36.9 C)    TempSrc: Oral    SpO2: 99%    Weight: 227 lb 12.8 oz (103.3 kg)    Height: 6\' 2"  (1.88 m)      Body mass index is 29.25 kg/m.  Physical Exam Vitals and nursing note reviewed.  Constitutional:      General: He is not in acute distress.    Appearance: Normal appearance. He is well-developed. He is not ill-appearing, toxic-appearing or diaphoretic.     Interventions: Face mask in place.  HENT:     Head: Normocephalic and atraumatic.     Jaw: No trismus.     Right Ear: External ear normal.     Left Ear: External ear normal.     Nose: Congestion present.  Eyes:     General: Lids are normal. No scleral icterus.       Right eye: No discharge.        Left eye: No discharge.     Conjunctiva/sclera: Conjunctivae normal.  Neck:     Trachea: Trachea and phonation normal. No tracheal deviation.  Cardiovascular:     Rate and Rhythm: Normal rate and regular rhythm.     Pulses: Normal pulses.          Radial pulses are 2+ on the right side and 2+ on the left side.       Posterior tibial pulses are 2+ on the right side and 2+ on the left side.     Heart sounds: Normal heart sounds. No murmur heard.    No friction rub. No  gallop.  Pulmonary:     Effort: Pulmonary effort is normal. No respiratory distress.     Breath sounds: Normal breath sounds. No stridor. No wheezing, rhonchi or rales.  Abdominal:     General: Bowel sounds are normal. There is no distension.     Palpations: Abdomen is soft.  Musculoskeletal:     Cervical back: Normal range of motion. No rigidity.     Right lower leg: No edema.     Left lower leg: No  edema.  Skin:    General: Skin is warm and dry.     Coloration: Skin is not jaundiced.     Findings: No rash.     Nails: There is no clubbing.  Neurological:     Mental Status: He is alert.     Cranial Nerves: No dysarthria or facial asymmetry.     Motor: No tremor or abnormal muscle tone.     Gait: Gait normal.  Psychiatric:        Mood and Affect: Mood normal.        Speech: Speech normal.        Behavior: Behavior normal. Behavior is cooperative.         Assessment & Plan:   Problem List Items Addressed This Visit       Cardiovascular and Mediastinum   Essential hypertension - Primary    Uncontrolled on lisinopril after multiple dose increases BP Readings from Last 3 Encounters:  02/12/22 (!) 158/88  08/15/21 (!) 156/87  08/11/21 128/82  Hx of stroke Add amlodipine 5 mg to increase, nurse BP check - plan to increase to 10 mg if not at goal when rechecked Goal to have BP <130/80 Work on DASH/diet/lifestyle       Relevant Medications   amLODipine (NORVASC) 5 MG tablet   Other Relevant Orders   COMPLETE METABOLIC PANEL WITH GFR   Intracranial vascular stenosis    Multiple attempts to help him get back with specialists team at [facility] and pt has not f/up over the past 2-3 years Info for [facility] Neuro will be sent to him again, he can establish locally, but given his hx and particular case it would be best to see prior team, last OV 02/2021, but again lost to f/up and he did not complete labs or imaging      Relevant Medications   amLODipine (NORVASC) 5 MG tablet   Other Relevant Orders   Ambulatory referral to Neurology   Vasculopathy    Pending work up with neuro team at [facility]      Relevant Medications   amLODipine (NORVASC) 5 MG tablet     Respiratory   Recurrent sinusitis    Recurrent sinus infections requiring abx (2x a year) with chronic rhinosinusitis and allergies, I have attempted to treat with antihistamines and intranasal steroid sprays/antihistamines and  Atrovent no improvement of symptoms or prevention of recurrent sinus infections  Encouraged pt to be on his medications daily to help prevent swelling, congestion and change for infections Ref to ENT       Relevant Medications   mometasone (NASONEX) 50 MCG/ACT nasal spray   levocetirizine (XYZAL) 5 MG tablet   montelukast (SINGULAIR) 10 MG tablet   Other Relevant Orders   Ambulatory referral to ENT     Nervous and Auditory   Hemiplegia of left 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 [facility])    See extensive note and plan above with intracranial vascular stenosis -needs to reestablish with neurology  and do the follow-up labs and imaging        Other   Heart murmur    If BP is difficult to get well controlled and pt has hx of murmur and stroke w/o prior ECHO or cardiology - we should get him established locally Pt is not currently having orthopnea, PND, lower extremity edema, palpitations, exertional chest pain, pressure shortness of breath, near-syncope      Mixed hyperlipidemia    Lipid panel checked last July, near goal Pt endorses good compliance Continue lipitor 40 and Zetia 10 mg Encouraged healthy diet/lifestyle  With hx of stroke goal would be LDL <70      Relevant Medications   amLODipine (NORVASC) 5 MG tablet   Other Relevant Orders   COMPLETE METABOLIC PANEL WITH GFR   History of ischemic stroke    Prior neurology team at [facility] did stop Plavix but was still in the middle of a workup, again encouraged patient to get back with [facility] specialist or get established with local specialist he states that it is difficult to get to the phone when called for referrals because of working second shift He did give [place] permission to put his wife's phone number on the referrals -Levada Dy      Relevant Orders   Ambulatory referral to Neurology   Prediabetes    Lab Results  Component Value Date   HGBA1C 5.8 (H) 08/11/2021  Last several labs have been stable, will recheck  and continue to monitor 1-2 x a year       Relevant Orders   COMPLETE METABOLIC PANEL WITH GFR   Hemoglobin A1c   Narcotic dependence ([place])    On pain management -taking Percocet 10 mg 4 times a day-per specialist Controlled substance database/PDMP reviewed today      Seasonal allergies    encouraged him to remain on daily antihistamine meds and nose sprays Encouraged continued med compliance for chronic symptom management follow-up with ENT      Relevant Medications   mometasone (NASONEX) 50 MCG/ACT nasal spray   levocetirizine (XYZAL) 5 MG tablet   montelukast (SINGULAIR) 10 MG tablet   Other Relevant Orders   Ambulatory referral to ENT   Other Visit Diagnoses     Encounter for medication monitoring       Relevant Orders   COMPLETE METABOLIC PANEL WITH GFR   Hemoglobin A1c   CBC with Differential/Platelet   Screening for colon cancer       Relevant Orders   Ambulatory referral to Gastroenterology   Suspected sleep apnea       chronic fatigue and daytime sleepiness   Relevant Orders   Ambulatory referral to Pulmonology   Antiplatelet or antithrombotic long-term use       Monitoring ASA use, no current signs or symptoms, monitoring labs   Relevant Orders   COMPLETE METABOLIC PANEL WITH GFR   CBC with Differential/Platelet         Modisette, ANGELA (Spouse)- for referrals per pt today [phone]      Return for 3-4 week BP f/up .  (anticipate needing to increase amlodipine dose) - recheck BP, hope to get him to goal <130/80, if pt is not at goal with recheck then he will need to increase amlodipine to 10 mg daily and f/up in office in the next month with provider   Delsa Grana, PA-C 02/12/22 10:31 AM

## 2022-02-12 NOTE — Assessment & Plan Note (Signed)
Recurrent sinus infections requiring abx (2x a year) with chronic rhinosinusitis and allergies, I have attempted to treat with antihistamines and intranasal steroid sprays/antihistamines and Atrovent no improvement of symptoms or prevention of recurrent sinus infections  Encouraged pt to be on his medications daily to help prevent swelling, congestion and change for infections Ref to ENT

## 2022-02-12 NOTE — Assessment & Plan Note (Addendum)
Lipid panel checked last July, near goal Pt endorses good compliance Continue lipitor 40 and Zetia 10 mg Encouraged healthy diet/lifestyle  With hx of stroke goal would be LDL <70

## 2022-02-13 ENCOUNTER — Telehealth: Payer: Self-pay | Admitting: Internal Medicine

## 2022-02-13 NOTE — Telephone Encounter (Signed)
Called patient to schedule new pulmonary appointment. No answer, vm full-Charles Alvarado

## 2022-02-20 ENCOUNTER — Other Ambulatory Visit: Payer: Self-pay | Admitting: *Deleted

## 2022-02-20 ENCOUNTER — Telehealth: Payer: Self-pay | Admitting: *Deleted

## 2022-02-20 DIAGNOSIS — Z1211 Encounter for screening for malignant neoplasm of colon: Secondary | ICD-10-CM

## 2022-02-20 MED ORDER — SUTAB 1479-225-188 MG PO TABS: ORAL_TABLET | ORAL | 0 refills | Status: DC

## 2022-02-20 NOTE — Telephone Encounter (Signed)
Gastroenterology Pre-Procedure Review  Request Date: 03/12/2022 Requesting Physician: Dr. Allen Norris  PATIENT REVIEW QUESTIONS: The patient responded to the following health history questions as indicated:    1. Are you having any GI issues? no 2. Do you have a personal history of Polyps? no 3. Do you have a family history of Colon Cancer or Polyps? no 4. Diabetes Mellitus? no 5. Joint replacements in the past 12 months?no 6. Major health problems in the past 3 months?no 7. Any artificial heart valves, MVP, or defibrillator?no    MEDICATIONS & ALLERGIES:    Patient reports the following regarding taking any anticoagulation/antiplatelet therapy:   Plavix, Coumadin, Eliquis, Xarelto, Lovenox, Pradaxa, Brilinta, or Effient? no Aspirin? no  Patient confirms/reports the following medications:  Current Outpatient Medications  Medication Sig Dispense Refill   Sodium Sulfate-Mag Sulfate-KCl (SUTAB) [phone] MG TABS Take 1 kit as instructed by mouth once 24 tablet 0   amitriptyline (ELAVIL) 25 MG tablet Take 25 mg by mouth at bedtime.     amLODipine (NORVASC) 5 MG tablet Take 1 tablet (5 mg total) by mouth daily. 30 tablet 0   aspirin 81 MG chewable tablet Chew by mouth.     atorvastatin (LIPITOR) 40 MG tablet TAKE 1 TABLET(40 MG) BY MOUTH DAILY 90 tablet 0   ezetimibe (ZETIA) 10 MG tablet Take 1 tablet (10 mg total) by mouth daily. 90 tablet 3   ID NOW COVID-19 KIT      levocetirizine (XYZAL) 5 MG tablet Take 1 tablet (5 mg total) by mouth every evening. 90 tablet 2   lisinopril (ZESTRIL) 40 MG tablet TAKE 1 TABLET(40 MG) BY MOUTH DAILY 90 tablet 0   mometasone (NASONEX) 50 MCG/ACT nasal spray Place 2 sprays into the nose daily. 1 each 12   montelukast (SINGULAIR) 10 MG tablet Take 1 tablet (10 mg total) by mouth at bedtime. 90 tablet 1   oxyCODONE-acetaminophen (PERCOCET) 10-325 MG tablet TK 1 T PO 5 TIMES A DAY     No current facility-administered medications for this visit.    Patient  confirms/reports the following allergies:  No Known Allergies  No orders of the defined types were placed in this encounter.   AUTHORIZATION INFORMATION Primary Insurance: 1D#: Group #:  Secondary Insurance: 1D#: Group #:  SCHEDULE INFORMATION: Date: [facility]   Time: Location: 03/11/2022

## 2022-02-27 NOTE — Telephone Encounter (Signed)
Copied from [place] [id number]. Topic: Referral - Status >> Feb 27, 2022  9:23 AM [name] wrote: Reason for CRM: Charles Alvarado [facility] neurology calling in re to referrals, states pt was previous a pt and has sch an appt for June 21 and is priority on a waitlist. Will stay in contact.

## 2022-03-01 ENCOUNTER — Telehealth: Payer: Self-pay | Admitting: Internal Medicine

## 2022-03-01 ENCOUNTER — Ambulatory Visit: Payer: BC Managed Care – PPO | Admitting: Internal Medicine

## 2022-03-01 ENCOUNTER — Encounter: Payer: Self-pay | Admitting: Internal Medicine

## 2022-03-01 VITALS — BP 128/80 | HR 65 | Temp 97.8°F | Resp 16 | Ht 74.0 in | Wt 234.0 lb

## 2022-03-01 DIAGNOSIS — G4719 Other hypersomnia: Secondary | ICD-10-CM

## 2022-03-01 DIAGNOSIS — I10 Essential (primary) hypertension: Secondary | ICD-10-CM

## 2022-03-01 DIAGNOSIS — G4733 Obstructive sleep apnea (adult) (pediatric): Secondary | ICD-10-CM | POA: Diagnosis not present

## 2022-03-01 NOTE — Progress Notes (Signed)
[facility] [place], [place] [address]  Pulmonary Sleep Medicine   Office Visit Note  Patient Name: Charles Alvarado DOB: 11/30/1970 MRN [id number]  Date of Service: 03/01/2022  Complaints/HPI: He states he has difficulty sleeping. Wakes up frequently and states he does have nocturia. States he is also snoring. Has elevated BP noted. States he feels like his sleep is not restful. He falls asleep in front of TV. Does not fall asleep if driving. No cough no fevers. States he has not gained weight recently.   STOP BANG RISK ASSESSMENT S (snore) Have you been told that you snore?     YES   T (tired) Are you often tired, fatigued, or sleepy during the day?   YES  O (obstruction) Do you stop breathing, choke, or gasp during sleep? YES   P (pressure) Do you have or are you being treated for high blood pressure? YES   B (BMI) Is your body index greater than 35 kg/m? NO   A (age) Are you 52 years old or older? YES   N (neck) Do you have a neck circumference greater than 16 inches?   NO   G (gender) Are you a male? YES   TOTAL STOP/BANG "YES" ANSWERS 6                                                                   ROS  General: (-) fever, (-) chills, (-) night sweats, (-) weakness Skin: (-) rashes, (-) itching,. Eyes: (-) visual changes, (-) redness, (-) itching. Nose and Sinuses: (-) nasal stuffiness or itchiness, (-) postnasal drip, (-) nosebleeds, (-) sinus trouble. Mouth and Throat: (-) sore throat, (-) hoarseness. Neck: (-) swollen glands, (-) enlarged thyroid, (-) neck pain. Respiratory: - cough, (-) bloody sputum, - shortness of breath, - wheezing. Cardiovascular: - ankle swelling, (-) chest pain. Lymphatic: (-) lymph node enlargement. Neurologic: (-) numbness, (-) tingling. Psychiatric: (-) anxiety, (-) depression   Current Medication: Outpatient Encounter Medications as of 03/01/2022  Medication Sig   amitriptyline (ELAVIL) 25  MG tablet Take 25 mg by mouth at bedtime.   amLODipine (NORVASC) 5 MG tablet Take 1 tablet (5 mg total) by mouth daily.   aspirin 81 MG chewable tablet Chew by mouth.   atorvastatin (LIPITOR) 40 MG tablet TAKE 1 TABLET(40 MG) BY MOUTH DAILY   ezetimibe (ZETIA) 10 MG tablet Take 1 tablet (10 mg total) by mouth daily.   ID NOW COVID-19 KIT    levocetirizine (XYZAL) 5 MG tablet Take 1 tablet (5 mg total) by mouth every evening.   lisinopril (ZESTRIL) 40 MG tablet TAKE 1 TABLET(40 MG) BY MOUTH DAILY   mometasone (NASONEX) 50 MCG/ACT nasal spray Place 2 sprays into the nose daily.   montelukast (SINGULAIR) 10 MG tablet Take 1 tablet (10 mg total) by mouth at bedtime.   oxyCODONE-acetaminophen (PERCOCET) 10-325 MG tablet TK 1 T PO 5 TIMES A DAY   Sodium Sulfate-Mag Sulfate-KCl (SUTAB) 8288551909 MG TABS Take 1 kit as instructed by mouth once   No facility-administered encounter medications on file as of 03/01/2022.    Surgical History: Past Surgical History:  Procedure Laterality Date   CLOSED REDUCTION SHOULDER DISLOCATION  06-2015   SHOULDER ARTHROSCOPY WITH OPEN  ROTATOR CUFF REPAIR AND DISTAL CLAVICLE ACROMINECTOMY Right 06/16/2015   Procedure: right shoulder arthroscopic repair of labral tear;  Surgeon: Juanell Fairly, MD;  Location: [facility] ORS;  Service: Orthopedics;  Laterality: Right;    Medical History: Past Medical History:  Diagnosis Date   Anemia    H/O AS A CHILD   Blood clots in brain    Claustrophobia    Heart murmur    ASYMPTOMATIC-RECENTLY DIAGNOSED 3 MONTHS  AGO(03-2015) BY PCP   Hyperlipidemia    Hypertension    Stroke (HCC)     Family History: Family History  Problem Relation Age of Onset   Hypertension Mother    Hypertension Father    Diabetes Father    Diabetes Sister    Hypertension Sister     Social History: Social History   Socioeconomic History   Marital status: Married    Spouse name: Marylene Land   Number of children: 0   Years of education: Not on  file   Highest education level: Not on file  Occupational History   Occupation: [employer]  Tobacco Use   Smoking status: Never   Smokeless tobacco: Never  Vaping Use   Vaping Use: Never used  Substance and Sexual Activity   Alcohol use: No    Alcohol/week: 0.0 standard drinks of alcohol   Drug use: No   Sexual activity: Yes    Partners: Female  Other Topics Concern   Not on file  Social History Narrative   Not on file   Social Determinants of Health   Financial Resource Strain: Low Risk  (09/18/2018)   Overall Financial Resource Strain (CARDIA)    Difficulty of Paying Living Expenses: Not hard at all  Food Insecurity: No Food Insecurity (09/18/2018)   Hunger Vital Sign    Worried About Running Out of Food in the Last Year: Never true    Ran Out of Food in the Last Year: Never true  Transportation Needs: No Transportation Needs (09/18/2018)   PRAPARE - Administrator, Civil Service (Medical): No    Lack of Transportation (Non-Medical): No  Physical Activity: Inactive (09/18/2018)   Exercise Vital Sign    Days of Exercise per Week: 0 days    Minutes of Exercise per Session: 0 min  Stress: No Stress Concern Present (09/18/2018)   [employer] of Occupational Health - Occupational Stress Questionnaire    Feeling of Stress : Only a little  Social Connections: Socially Integrated (09/18/2018)   Social Connection and Isolation Panel [NHANES]    Frequency of Communication with Friends and Family: More than three times a week    Frequency of Social Gatherings with Friends and Family: Never    Attends Religious Services: More than 4 times per year    Active Member of [employer] or Organizations: Yes    Attends Banker Meetings: 1 to 4 times per year    Marital Status: Married  Catering manager Violence: Not At Risk (09/18/2018)   Humiliation, Afraid, Rape, and Kick questionnaire    Fear of Current or Ex-Partner: No    Emotionally Abused: No    Physically  Abused: No    Sexually Abused: No    Vital Signs: Blood pressure 128/80, pulse 65, temperature 97.8 F (36.6 C), resp. rate 16, height 6\' 2"  (1.88 m), weight 234 lb (106.1 kg), SpO2 98 %.  Examination: General Appearance: The patient is well-developed, well-nourished, and in no distress. Skin: Gross inspection of skin unremarkable. Head: normocephalic, no gross  deformities. Eyes: no gross deformities noted. ENT: ears appear grossly normal no exudates. Neck: Supple. No thyromegaly. No LAD. Respiratory: no rhonchi noted. Cardiovascular: Normal S1 and S2 without murmur or rub. Extremities: No cyanosis. pulses are equal. Neurologic: Alert and oriented. No involuntary movements.  LABS: No results found for this or any previous visit (from the past 2160 hour(s)).  Radiology: No results found.  No results found.  No results found.    Assessment and Plan: Patient Active Problem List   Diagnosis Date Noted   Narcotic dependence ([place]) 02/12/2022   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   Recurrent sinusitis 02/12/2022   Seasonal allergies 02/12/2022   Hemiplegia of left 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 ([place]) 12/10/2018   History of ischemic stroke 12/10/2018   Prediabetes 12/10/2018   Intracranial vascular stenosis 09/13/2018   Heart murmur 02/21/2015   Essential hypertension 02/21/2015   Mixed hyperlipidemia 02/21/2015    1. OSA (obstructive sleep apnea) Patient needs to have a sleep study done STOP-BANG score is 6 we will continue with following up after the sleep study is done. - PSG Sleep Study; Future  2. Obesity, morbid ([place]) He does have a overall body weight of 219 pounds spoke to him about the importance of diet exercise increasing activity and decreasing caloric intake and working on reduction of the weight  3. Other hypersomnia Probable underlying obstructive sleep apnea we will get sleep study done if positive placed on PAP therapy and then  reassess  4. Essential hypertension Patient does have hypertension slight increase in diastolic pressure.  Could very well be related to underlying obstructive sleep apnea which may be actually making his blood pressure more labile  General Counseling: I have discussed the findings of the evaluation and examination with Charles Alvarado.  I have also discussed any further diagnostic evaluation thatmay be needed or ordered today. Zaheer verbalizes understanding of the findings of todays visit. We also reviewed his medications today and discussed drug interactions and side effects including but not limited excessive drowsiness and altered mental states. We also discussed that there is always a risk not just to him but also people around him. he has been encouraged to call the office with any questions or concerns that should arise related to todays visit.  No orders of the defined types were placed in this encounter.    Time spent: 13  I have personally obtained a history, examined the patient, evaluated laboratory and imaging results, formulated the assessment and plan and placed orders.    Allyne Gee, MD [facility] Pulmonary and Critical Care Sleep medicine

## 2022-03-01 NOTE — Telephone Encounter (Signed)
Awaiting 03/01/22 office notes for SS order-Toni 

## 2022-03-01 NOTE — Patient Instructions (Signed)

## 2022-03-05 NOTE — Telephone Encounter (Signed)
Referral was already placed on 02/12/2022.  Referral has been sent to [facility] Neurology   P: [phone] F: [phone]   Release ID # [id number]

## 2022-03-06 ENCOUNTER — Encounter: Payer: Self-pay | Admitting: Gastroenterology

## 2022-03-11 ENCOUNTER — Other Ambulatory Visit: Payer: Self-pay | Admitting: Family Medicine

## 2022-03-11 DIAGNOSIS — I10 Essential (primary) hypertension: Secondary | ICD-10-CM

## 2022-03-12 ENCOUNTER — Encounter
Admission: RE | Disposition: A | Discharge status: Home / Self Care | Payer: Self-pay | Source: Home / Self Care | Attending: Gastroenterology

## 2022-03-12 ENCOUNTER — Encounter: Payer: Self-pay | Admitting: Gastroenterology

## 2022-03-12 ENCOUNTER — Ambulatory Visit: Payer: BC Managed Care – PPO | Admitting: Family Medicine

## 2022-03-12 ENCOUNTER — Ambulatory Visit: Payer: BC Managed Care – PPO | Admitting: Anesthesiology

## 2022-03-12 ENCOUNTER — Other Ambulatory Visit: Payer: Self-pay

## 2022-03-12 ENCOUNTER — Ambulatory Visit
Admission: RE | Admit: 2022-03-12 | Discharge: 2022-03-12 | Disposition: A | Discharge status: Home / Self Care | Payer: BC Managed Care – PPO | Attending: Gastroenterology | Admitting: Gastroenterology

## 2022-03-12 DIAGNOSIS — E785 Hyperlipidemia, unspecified: Secondary | ICD-10-CM | POA: Diagnosis not present

## 2022-03-12 DIAGNOSIS — Z8673 Personal history of transient ischemic attack (TIA), and cerebral infarction without residual deficits: Secondary | ICD-10-CM | POA: Insufficient documentation

## 2022-03-12 DIAGNOSIS — I10 Essential (primary) hypertension: Secondary | ICD-10-CM | POA: Diagnosis not present

## 2022-03-12 DIAGNOSIS — Z1211 Encounter for screening for malignant neoplasm of colon: Secondary | ICD-10-CM

## 2022-03-12 DIAGNOSIS — K635 Polyp of colon: Secondary | ICD-10-CM

## 2022-03-12 DIAGNOSIS — K529 Noninfective gastroenteritis and colitis, unspecified: Secondary | ICD-10-CM | POA: Diagnosis not present

## 2022-03-12 DIAGNOSIS — K640 First degree hemorrhoids: Secondary | ICD-10-CM | POA: Insufficient documentation

## 2022-03-12 HISTORY — PX: COLONOSCOPY WITH PROPOFOL: SHX5780

## 2022-03-12 HISTORY — PX: POLYPECTOMY: SHX5525

## 2022-03-12 SURGERY — COLONOSCOPY WITH PROPOFOL
Anesthesia: General | Site: Rectum

## 2022-03-12 MED ORDER — LACTATED RINGERS IV SOLN: INTRAVENOUS | Status: DC

## 2022-03-12 MED ORDER — LIDOCAINE HCL (CARDIAC) PF 100 MG/5ML IV SOSY
PREFILLED_SYRINGE | INTRAVENOUS | Status: DC | PRN
  Administered 2022-03-12: 80 mg via INTRAVENOUS

## 2022-03-12 MED ORDER — STERILE WATER FOR IRRIGATION IR SOLN
Status: DC | PRN
  Administered 2022-03-12: 1

## 2022-03-12 MED ORDER — PROPOFOL 10 MG/ML IV BOLUS
INTRAVENOUS | Status: DC | PRN
  Administered 2022-03-12: 40 mg via INTRAVENOUS
  Administered 2022-03-12: 80 mg via INTRAVENOUS
  Administered 2022-03-12 (×3): 40 mg via INTRAVENOUS

## 2022-03-12 MED ORDER — SODIUM CHLORIDE 0.9 % IV SOLN: INTRAVENOUS | Status: DC

## 2022-03-12 SURGICAL SUPPLY — 21 items

## 2022-03-12 NOTE — Transfer of Care (Signed)
Immediate Anesthesia Transfer of Care Note  Patient: Charles Alvarado  Procedure(s) Performed: COLONOSCOPY WITH PROPOFOL (Rectum) POLYPECTOMY (Rectum)  Patient Location: PACU  Anesthesia Type: General  Level of Consciousness: awake, alert  and patient cooperative  Airway and Oxygen Therapy: Patient Spontanous Breathing and Patient connected to supplemental oxygen  Post-op Assessment: Post-op Vital signs reviewed, Patient's Cardiovascular Status Stable, Respiratory Function Stable, Patent Airway and No signs of Nausea or vomiting  Post-op Vital Signs: Reviewed and stable  Complications: No notable events documented.

## 2022-03-12 NOTE — H&P (Signed)
Lucilla Lame, MD [address] [place], [place] [address] Phone: [phone] Fax : [phone]  Primary Care Physician:  Delsa Grana, PA-C Primary Gastroenterologist:  Dr. Allen Norris  Pre-Procedure History & Physical: HPI:  Charles Alvarado is a 52 y.o. male is here for a screening colonoscopy.   Past Medical History:  Diagnosis Date   Anemia    H/O AS A CHILD   Blood clots in brain    Claustrophobia    Heart murmur    ASYMPTOMATIC-RECENTLY DIAGNOSED 3 MONTHS  AGO(03-2015) BY PCP   Hyperlipidemia    Hypertension    Stroke ([place]) 09/12/2018    Past Surgical History:  Procedure Laterality Date   CLOSED REDUCTION SHOULDER DISLOCATION  06-2015   SHOULDER ARTHROSCOPY WITH OPEN ROTATOR CUFF REPAIR AND DISTAL CLAVICLE ACROMINECTOMY Right 06/16/2015   Procedure: right shoulder arthroscopic repair of labral tear;  Surgeon: Thornton Park, MD;  Location: [facility] ORS;  Service: Orthopedics;  Laterality: Right;    Prior to Admission medications   Medication Sig Start Date End Date Taking? Authorizing Provider  amitriptyline (ELAVIL) 25 MG tablet Take 25 mg by mouth. 12/01/20  Yes [provider]  amLODipine (NORVASC) 5 MG tablet Take 1 tablet (5 mg total) by mouth daily. 02/12/22  Yes Delsa Grana, PA-C  aspirin 81 MG chewable tablet Chew by mouth. 09/14/18  Yes [provider]  atorvastatin (LIPITOR) 40 MG tablet TAKE 1 TABLET(40 MG) BY MOUTH DAILY 02/06/22  Yes Delsa Grana, PA-C  ezetimibe (ZETIA) 10 MG tablet Take 1 tablet (10 mg total) by mouth daily. 06/12/21  Yes Delsa Grana, PA-C  lisinopril (ZESTRIL) 40 MG tablet TAKE 1 TABLET(40 MG) BY MOUTH DAILY 01/08/22  Yes Bo Merino, FNP  oxyCODONE-acetaminophen (PERCOCET) 10-325 MG tablet TK 1 T PO 5 TIMES A DAY 10/11/18  Yes [provider]  Sodium Sulfate-Mag Sulfate-KCl (SUTAB) [id number] MG TABS Take 1 kit as instructed by mouth once 02/20/22  Yes Lucilla Lame, MD  ID NOW COVID-19 KIT  10/12/20    [provider]  levocetirizine (XYZAL) 5 MG tablet Take 1 tablet (5 mg total) by mouth every evening. Patient not taking: Reported on 03/06/2022 02/12/22   Delsa Grana, PA-C  mometasone (NASONEX) 50 MCG/ACT nasal spray Place 2 sprays into the nose daily. Patient not taking: Reported on 03/06/2022 02/12/22   Delsa Grana, PA-C  montelukast (SINGULAIR) 10 MG tablet Take 1 tablet (10 mg total) by mouth at bedtime. Patient not taking: Reported on 03/06/2022 02/12/22   Delsa Grana, PA-C    Allergies as of 02/20/2022   (No Known Allergies)    Family History  Problem Relation Age of Onset   Hypertension Mother    Hypertension Father    Diabetes Father    Diabetes Sister    Hypertension Sister     Social History   Socioeconomic History   Marital status: Married    Spouse name: Levada Dy   Number of children: 0   Years of education: Not on file   Highest education level: Not on file  Occupational History   Occupation: [employer]  Tobacco Use   Smoking status: Never   Smokeless tobacco: Never  Vaping Use   Vaping Use: Never used  Substance and Sexual Activity   Alcohol use: No    Alcohol/week: 0.0 standard drinks of alcohol   Drug use: No   Sexual activity: Yes    Partners: Female  Other Topics Concern   Not on file  Social History  Narrative   Not on file   Social Determinants of Health   Financial Resource Strain: Low Risk  (09/18/2018)   Overall Financial Resource Strain (CARDIA)    Difficulty of Paying Living Expenses: Not hard at all  Food Insecurity: No Food Insecurity (09/18/2018)   Hunger Vital Sign    Worried About Running Out of Food in the Last Year: Never true    Ran Out of Food in the Last Year: Never true  Transportation Needs: No Transportation Needs (09/18/2018)   PRAPARE - Hydrologist (Medical): No    Lack of Transportation (Non-Medical): No  Physical Activity: Inactive (09/18/2018)   Exercise Vital Sign    Days of Exercise  per Week: 0 days    Minutes of Exercise per Session: 0 min  Stress: No Stress Concern Present (09/18/2018)   [place]    Feeling of Stress : Only a little  Social Connections: Socially Integrated (09/18/2018)   Social Connection and Isolation Panel [NHANES]    Frequency of Communication with Friends and Family: More than three times a week    Frequency of Social Gatherings with Friends and Family: Never    Attends Religious Services: More than 4 times per year    Active Member of [employer] or Organizations: Yes    Attends Archivist Meetings: 1 to 4 times per year    Marital Status: Married  Human resources officer Violence: Not At Risk (09/18/2018)   Humiliation, Afraid, Rape, and Kick questionnaire    Fear of Current or Ex-Partner: No    Emotionally Abused: No    Physically Abused: No    Sexually Abused: No    Review of Systems: See HPI, otherwise negative ROS  Physical Exam: BP (!) 142/82   Temp 99 F (37.2 C) (Tympanic)   Ht 6' 2.02" (1.88 m)   Wt 99.7 kg   SpO2 99%   BMI 28.20 kg/m  General:   Alert,  pleasant and cooperative in NAD Head:  Normocephalic and atraumatic. Neck:  Supple; no masses or thyromegaly. Lungs:  Clear throughout to auscultation.    Heart:  Regular rate and rhythm. Abdomen:  Soft, nontender and nondistended. Normal bowel sounds, without guarding, and without rebound.   Neurologic:  Alert and  oriented x4;  grossly normal neurologically.  Impression/Plan: Charles Alvarado is now here to undergo a screening colonoscopy.  Risks, benefits, and alternatives regarding colonoscopy have been reviewed with the patient.  Questions have been answered.  All parties agreeable.

## 2022-03-12 NOTE — Anesthesia Preprocedure Evaluation (Signed)
Anesthesia Evaluation  Patient identified by MRN, date of birth, ID band Patient awake    Reviewed: Allergy & Precautions, H&P , NPO status , Patient's Chart, lab work & pertinent test results, reviewed documented beta blocker date and time   History of Anesthesia Complications Negative for: history of anesthetic complications  Airway Mallampati: III  TM Distance: >3 FB Neck ROM: full    Dental  (+) Implants, Dental Advidsory Given, Missing, Teeth Intact   Pulmonary neg pulmonary ROS   Pulmonary exam normal breath sounds clear to auscultation       Cardiovascular Exercise Tolerance: Good hypertension, (-) angina (-) Past MI and (-) Cardiac Stents Normal cardiovascular exam(-) dysrhythmias + Valvular Problems/Murmurs  Rhythm:regular Rate:Normal     Neuro/Psych neg Seizures CVA, Residual Symptoms  negative psych ROS   GI/Hepatic negative GI ROS, Neg liver ROS,,,  Endo/Other  negative endocrine ROS    Renal/GU negative Renal ROS  negative genitourinary   Musculoskeletal   Abdominal   Peds  Hematology negative hematology ROS (+)   Anesthesia Other Findings Past Medical History: No date: Anemia     Comment:  H/O AS A CHILD No date: Blood clots in brain No date: Claustrophobia No date: Heart murmur     Comment:  ASYMPTOMATIC-RECENTLY DIAGNOSED 3 MONTHS  AGO(03-2015) BY              PCP No date: Hyperlipidemia No date: Hypertension 09/12/2018: Stroke ([place])   Reproductive/Obstetrics negative OB ROS                             Anesthesia Physical Anesthesia Plan  ASA: 2  Anesthesia Plan: General   Post-op Pain Management:    Induction: Intravenous  PONV Risk Score and Plan: 2 and Propofol infusion and TIVA  Airway Management Planned: Natural Airway and Nasal Cannula  Additional Equipment:   Intra-op Plan:   Post-operative Plan:   Informed Consent: I have reviewed the patients  History and Physical, chart, labs and discussed the procedure including the risks, benefits and alternatives for the proposed anesthesia with the patient or authorized representative who has indicated his/her understanding and acceptance.     Dental Advisory Given  Plan Discussed with: Anesthesiologist, CRNA and Surgeon  Anesthesia Plan Comments:        Anesthesia Quick Evaluation

## 2022-03-12 NOTE — Op Note (Signed)
[facility] Gastroenterology Patient Name: Charles Alvarado Procedure Date: 03/12/2022 8:22 AM MRN: [id number] Account #: [id number] Date of Birth: 1970/06/17 Admit Type: Outpatient Age: 52 Room: [facility] OR ROOM 01 Gender: Male Note Status: Finalized Instrument Name: [id number] Procedure:             Colonoscopy Indications:           Screening for colorectal malignant neoplasm Providers:             Lucilla Lame MD, MD Referring MD:          Lucilla Lame MD, MD (Referring MD), Delsa Grana                         (Referring MD) Medicines:             Propofol per Anesthesia Complications:         No immediate complications. Procedure:             Pre-Anesthesia Assessment:                        - Prior to the procedure, a History and Physical was                         performed, and patient medications and allergies were                         reviewed. The patient's tolerance of previous                         anesthesia was also reviewed. The risks and benefits                         of the procedure and the sedation options and risks                         were discussed with the patient. All questions were                         answered, and informed consent was obtained. Prior                         Anticoagulants: The patient has taken no anticoagulant                         or antiplatelet agents. ASA Grade Assessment: II - A                         patient with mild systemic disease. After reviewing                         the risks and benefits, the patient was deemed in                         satisfactory condition to undergo the procedure.                        After obtaining informed consent, the colonoscope was  passed under direct vision. Throughout the procedure,                         the patient's blood pressure, pulse, and oxygen                         saturations were monitored continuously. The                          Colonoscope was introduced through the anus and                         advanced to the the cecum, identified by appendiceal                         orifice and ileocecal valve. The colonoscopy was                         performed without difficulty. The patient tolerated                         the procedure well. The quality of the bowel                         preparation was excellent. Findings:      The perianal and digital rectal examinations were normal.      A 5 mm polyp was found in the cecum. The polyp was sessile. The polyp       was removed with a cold snare. Resection and retrieval were complete.      Non-bleeding internal hemorrhoids were found during retroflexion. The       hemorrhoids were Grade I (internal hemorrhoids that do not prolapse). Impression:            - One 5 mm polyp in the cecum, removed with a cold                         snare. Resected and retrieved.                        - Non-bleeding internal hemorrhoids. Recommendation:        - Discharge patient to home.                        - Resume previous diet.                        - Continue present medications.                        - Await pathology results.                        - If the pathology report reveals adenomatous tissue,                         then repeat the colonoscopy for surveillance in 7                         years. Procedure Code(s):     --- Professional ---  45385, Colonoscopy, flexible; with removal of                         tumor(s), polyp(s), or other lesion(s) by snare                         technique Diagnosis Code(s):     --- Professional ---                        Z12.11, Encounter for screening for malignant neoplasm                         of colon                        D12.0, Benign neoplasm of cecum CPT copyright 2022 American Medical Association. All rights reserved. The codes documented in this report are preliminary and upon coder  review may  be revised to meet current compliance requirements. Lucilla Lame MD, MD 03/12/2022 8:47:05 AM This report has been signed electronically. Number of Addenda: 0 Note Initiated On: 03/12/2022 8:22 AM Scope Withdrawal Time: 0 hours 8 minutes 8 seconds  Total Procedure Duration: 0 hours 10 minutes 23 seconds  Estimated Blood Loss:  Estimated blood loss: none.      [facility]

## 2022-03-12 NOTE — Anesthesia Postprocedure Evaluation (Signed)
Anesthesia Post Note  Patient: Charles Alvarado  Procedure(s) Performed: COLONOSCOPY WITH PROPOFOL (Rectum) POLYPECTOMY (Rectum)  Patient location during evaluation: PACU Anesthesia Type: General Level of consciousness: awake and alert Pain management: pain level controlled Vital Signs Assessment: post-procedure vital signs reviewed and stable Respiratory status: spontaneous breathing, nonlabored ventilation, respiratory function stable and patient connected to nasal cannula oxygen Cardiovascular status: blood pressure returned to baseline and stable Postop Assessment: no apparent nausea or vomiting Anesthetic complications: no   No notable events documented.   Last Vitals:  Vitals:   03/12/22 0900 03/12/22 0905  BP: 108/73 112/74  Pulse: (!) 57 (!) 58  Resp: (!) 23 17  Temp:  (!) 36.3 C  SpO2: 99% 98%    Last Pain:  Vitals:   03/12/22 0905  TempSrc:   PainSc: 0-No pain                 Martha Clan

## 2022-03-13 ENCOUNTER — Telehealth: Payer: Self-pay | Admitting: Internal Medicine

## 2022-03-13 ENCOUNTER — Encounter: Payer: Self-pay | Admitting: Gastroenterology

## 2022-03-13 LAB — SURGICAL PATHOLOGY

## 2022-03-13 NOTE — Telephone Encounter (Signed)
SS order placed in [facility] folder-Toni 

## 2022-03-14 ENCOUNTER — Encounter: Payer: Self-pay | Admitting: Gastroenterology

## 2022-03-22 ENCOUNTER — Ambulatory Visit: Payer: BC Managed Care – PPO | Admitting: Family Medicine

## 2022-03-22 ENCOUNTER — Encounter: Payer: Self-pay | Admitting: Family Medicine

## 2022-03-22 VITALS — BP 110/68 | HR 81 | Temp 98.0°F | Resp 16 | Ht 74.0 in | Wt 220.9 lb

## 2022-03-22 DIAGNOSIS — R350 Frequency of micturition: Secondary | ICD-10-CM

## 2022-03-22 DIAGNOSIS — R7303 Prediabetes: Secondary | ICD-10-CM | POA: Diagnosis not present

## 2022-03-22 DIAGNOSIS — J302 Other seasonal allergic rhinitis: Secondary | ICD-10-CM | POA: Diagnosis not present

## 2022-03-22 DIAGNOSIS — I679 Cerebrovascular disease, unspecified: Secondary | ICD-10-CM

## 2022-03-22 DIAGNOSIS — J329 Chronic sinusitis, unspecified: Secondary | ICD-10-CM

## 2022-03-22 DIAGNOSIS — Z5181 Encounter for therapeutic drug level monitoring: Secondary | ICD-10-CM

## 2022-03-22 DIAGNOSIS — I999 Unspecified disorder of circulatory system: Secondary | ICD-10-CM

## 2022-03-22 DIAGNOSIS — I10 Essential (primary) hypertension: Secondary | ICD-10-CM | POA: Diagnosis not present

## 2022-03-22 NOTE — Progress Notes (Signed)
Name: Charles Alvarado   MRN: [id number]    DOB: July 07, 1970   Date:03/22/2022       Progress Note  Chief Complaint  Patient presents with   Follow-up   Hypertension     Subjective:   Charles Alvarado is a 52 y.o. male, presents to clinic for recheck on uncontrolled HTN  BP Readings from Last 6 Encounters:  03/22/22 110/68  03/12/22 112/74  03/01/22 128/80  02/12/22 (!) 150/88  08/15/21 (!) 156/87  08/11/21 128/82  On lisinopril 40 mg (dose increased) and amlodipine 5 mg  BP is better today and other recent reading better than when he was last in our office  Amlodipine - noting urinary frequency that is new and very bothersome only after starting amlodipine  Prediabetes Lab Results  Component Value Date   HGBA1C 5.8 (H) 08/11/2021   He has improved his diet over the past month Lost some weight since last OV Wt Readings from Last 5 Encounters:  03/22/22 220 lb 14.4 oz (100.2 kg)  03/12/22 219 lb 11.2 oz (99.7 kg)  03/01/22 234 lb (106.1 kg)  02/12/22 227 lb 12.8 oz (103.3 kg)  08/11/21 227 lb 11.2 oz (103.3 kg)   BMI Readings from Last 5 Encounters:  03/22/22 28.36 kg/m  03/12/22 28.20 kg/m  03/01/22 30.04 kg/m  02/12/22 29.25 kg/m  08/11/21 29.23 kg/m   Having urinary sx - screening questions completed today   IPSS     Row Name 03/22/22 1040         International Prostate Symptom Score   How often have you had the sensation of not emptying your bladder? Not at All     How often have you had to urinate less than every two hours? More than half the time     How often have you found you stopped and started again several times when you urinated? Not at All     How often have you found it difficult to postpone urination? Not at All     How often have you had a weak urinary stream? Not at All     How often have you had to strain to start urination? Not at All     How many times did you typically get up at night to urinate? 3 Times     Total IPSS  Score 7       Quality of Life due to urinary symptoms   If you were to spend the rest of your life with your urinary condition just the way it is now how would you feel about that? Mostly Disatisfied                 Current Outpatient Medications:    amLODipine (NORVASC) 5 MG tablet, TAKE 1 TABLET(5 MG) BY MOUTH DAILY, Disp: 30 tablet, Rfl: 0   aspirin 81 MG chewable tablet, Chew by mouth., Disp: , Rfl:    atorvastatin (LIPITOR) 40 MG tablet, TAKE 1 TABLET(40 MG) BY MOUTH DAILY, Disp: 90 tablet, Rfl: 0   ezetimibe (ZETIA) 10 MG tablet, Take 1 tablet (10 mg total) by mouth daily., Disp: 90 tablet, Rfl: 3   lisinopril (ZESTRIL) 40 MG tablet, TAKE 1 TABLET(40 MG) BY MOUTH DAILY, Disp: 90 tablet, Rfl: 0   amitriptyline (ELAVIL) 25 MG tablet, Take 25 mg by mouth. (Patient not taking: Reported on 03/22/2022), Disp: , Rfl:    ID NOW COVID-19 KIT, , Disp: , Rfl:    levocetirizine (XYZAL) 5 MG  tablet, Take 1 tablet (5 mg total) by mouth every evening. (Patient not taking: Reported on 03/06/2022), Disp: 90 tablet, Rfl: 2   mometasone (NASONEX) 50 MCG/ACT nasal spray, Place 2 sprays into the nose daily. (Patient not taking: Reported on 03/06/2022), Disp: 1 each, Rfl: 12   montelukast (SINGULAIR) 10 MG tablet, Take 1 tablet (10 mg total) by mouth at bedtime. (Patient not taking: Reported on 03/06/2022), Disp: 90 tablet, Rfl: 1   oxyCODONE-acetaminophen (PERCOCET) 10-325 MG tablet, TK 1 T PO 5 TIMES A DAY (Patient not taking: Reported on 03/22/2022), Disp: , Rfl:    Sodium Sulfate-Mag Sulfate-KCl (SUTAB) [phone] MG TABS, Take 1 kit as instructed by mouth once (Patient not taking: Reported on 03/22/2022), Disp: 24 tablet, Rfl: 0  Patient Active Problem List   Diagnosis Date Noted   Screening for colon cancer 03/12/2022   Polyp of colon 03/12/2022   Narcotic dependence ([place]) 02/12/2022   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   Recurrent sinusitis 02/12/2022   Seasonal allergies 02/12/2022   Hemiplegia of left  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 ([place]) 12/10/2018   History of ischemic stroke 12/10/2018   Prediabetes 12/10/2018   Intracranial vascular stenosis 09/13/2018   Heart murmur 02/21/2015   Essential hypertension 02/21/2015   Mixed hyperlipidemia 02/21/2015    Past Surgical History:  Procedure Laterality Date   CLOSED REDUCTION SHOULDER DISLOCATION  06-2015   COLONOSCOPY WITH PROPOFOL N/A 03/12/2022   Procedure: COLONOSCOPY WITH PROPOFOL;  Surgeon: Lucilla Lame, MD;  Location: [place];  Service: Endoscopy;  Laterality: N/A;   POLYPECTOMY  03/12/2022   Procedure: POLYPECTOMY;  Surgeon: Lucilla Lame, MD;  Location: [place];  Service: Endoscopy;;   SHOULDER ARTHROSCOPY WITH OPEN ROTATOR CUFF REPAIR AND DISTAL CLAVICLE ACROMINECTOMY Right 06/16/2015   Procedure: right shoulder arthroscopic repair of labral tear;  Surgeon: Thornton Park, MD;  Location: [facility];  Service: Orthopedics;  Laterality: Right;    Family History  Problem Relation Age of Onset   Hypertension Mother    Hypertension Father    Diabetes Father    Diabetes Sister    Hypertension Sister     Social History   Tobacco Use   Smoking status: Never   Smokeless tobacco: Never  Vaping Use   Vaping Use: Never used  Substance Use Topics   Alcohol use: No    Alcohol/week: 0.0 standard drinks of alcohol   Drug use: No     No Known Allergies  Health Maintenance  Topic Date Due   COVID-19 Vaccine (3 - 2023-24 season) 04/07/2022 (Originally 10/06/2021)   Zoster Vaccines- Shingrix (1 of 2) 05/14/2022 (Originally 08/26/2020)   DTaP/Tdap/Td (2 - Td or Tdap) 04/24/2025   COLONOSCOPY (Pts 45-96yr Insurance coverage will need to be confirmed)  03/12/2029   Hepatitis C Screening  Completed   HIV Screening  Completed   HPV VACCINES  Aged Out   INFLUENZA VACCINE  Discontinued    Chart Review Today: I personally reviewed active problem list, medication list, allergies, family  history, social history, health maintenance, notes from last encounter, lab results, imaging with the patient/caregiver today.   Review of Systems  Constitutional: Negative.   HENT: Negative.    Eyes: Negative.   Respiratory: Negative.    Cardiovascular: Negative.   Gastrointestinal: Negative.   Endocrine: Negative.   Genitourinary: Negative.   Musculoskeletal: Negative.   Skin: Negative.   Allergic/Immunologic: Negative.   Neurological: Negative.   Hematological: Negative.   Psychiatric/Behavioral: Negative.    All  other systems reviewed and are negative.    Objective:   Vitals:   03/22/22 1008  BP: 110/68  Pulse: 81  Resp: 16  Temp: 98 F (36.7 C)  TempSrc: Oral  SpO2: 98%  Weight: 220 lb 14.4 oz (100.2 kg)  Height: 6' 2"$  (1.88 m)    Body mass index is 28.36 kg/m.  Physical Exam Vitals and nursing note reviewed.  Constitutional:      General: He is not in acute distress.    Appearance: Normal appearance. He is well-developed and well-groomed. He is not ill-appearing, toxic-appearing or diaphoretic.  HENT:     Head: Normocephalic and atraumatic.     Jaw: No trismus.     Right Ear: External ear normal.     Left Ear: External ear normal.  Eyes:     General: Lids are normal. No scleral icterus.       Right eye: No discharge.        Left eye: No discharge.     Conjunctiva/sclera: Conjunctivae normal.  Neck:     Trachea: Trachea and phonation normal. No tracheal deviation.  Cardiovascular:     Rate and Rhythm: Normal rate and regular rhythm.     Pulses: Normal pulses.          Radial pulses are 2+ on the right side and 2+ on the left side.       Posterior tibial pulses are 2+ on the right side and 2+ on the left side.     Heart sounds: Normal heart sounds. No murmur heard.    No friction rub. No gallop.  Pulmonary:     Effort: Pulmonary effort is normal. No respiratory distress.     Breath sounds: Normal breath sounds. No stridor. No wheezing, rhonchi or  rales.  Abdominal:     General: Bowel sounds are normal. There is no distension.     Palpations: Abdomen is soft.  Musculoskeletal:     Cervical back: Normal range of motion. No rigidity.     Right lower leg: No edema.     Left lower leg: No edema.  Skin:    General: Skin is warm and dry.     Coloration: Skin is not jaundiced.     Findings: No rash.     Nails: There is no clubbing.  Neurological:     Mental Status: He is alert.     Cranial Nerves: No dysarthria or facial asymmetry.     Motor: No tremor or abnormal muscle tone.     Gait: Gait normal.  Psychiatric:        Mood and Affect: Mood normal.        Speech: Speech normal.        Behavior: Behavior normal. Behavior is cooperative.         Assessment & Plan:   Problem List Items Addressed This Visit       Cardiovascular and Mediastinum   Essential hypertension - Primary    Improved BP control after med changes - lisinopril increased again back to 40 mg daily and norvasc 5 mg started with reported urinary SE He also has worked on diet/lifestyle efforts BP Readings from Last 3 Encounters:  03/22/22 110/68  03/12/22 112/74  03/01/22 128/80  Continue lisinopril 40 mg daily Trial of holding norvasc or adjusting time of day that he takes it to see if urinary sx are related to norvasc If he decides to stop the med asked that he let us know and  that we also confirm BP is still well controlled       Relevant Orders   COMPLETE METABOLIC PANEL WITH GFR (Completed)   Intracranial vascular stenosis    Pending neuro f/up and scans      Vasculopathy    Still pending full eval by his team - he did reconnect with them         Other   Prediabetes    He has lost weight and reports sig diet/lifestyle efforts Recheck labs      Relevant Orders   COMPLETE METABOLIC PANEL WITH GFR (Completed)   Hemoglobin A1c (Completed)   Seasonal allergies    Encouraged him to stay on prescribed meds to avoid recurrent sinusitis        Other Visit Diagnoses     Encounter for medication monitoring       Relevant Orders   COMPLETE METABOLIC PANEL WITH GFR (Completed)   Hemoglobin A1c (Completed)   Urinary frequency       screen for infection, check PSA, recheck A1C - pt concerned its a medicine SE - if work up neg we will have him hold/adjust med to see if sx resolve   Relevant Orders   Urinalysis, Routine w reflex microscopic (Completed)   PSA (Completed)   Microalbumin / creatinine urine ratio (Completed)   Hemoglobin A1c (Completed)        Return in about 3 months (around 06/20/2022) for Routine follow-up.   Delsa Grana, PA-C 03/22/22 10:19 AM

## 2022-03-22 NOTE — Patient Instructions (Addendum)
07/27/2022 2:45 PM ED - upcoming neurology appt at [facility] 02/22/2022 [place] "Gerald Stabs" - 52 y.o. Male; born [birth year]Encounter Summary, generated on Feb. 14, 2024February 14, 2024  Reason for Visit  Reason for Visit - Reason Onset Date Comments  Appt Needed per Staff msg 02/22/2022     Encounter Details  Encounter Details Date Type Department Care Team (Latest Contact Info) Description  02/22/2022 Telephone [facility]  [address], [place] [address]  [phone]  [address], [employer]  [place], [place] [address]  [phone] (Work)  [phone] (Fax)  Appt Needed per Staff msg     Progress Notes - documented in this encounter Virgel Bouquet - 02/22/2022 12:16 PM EST Summary: Appt Needed per Staff msg  Formatting of this note might be different from the original. Images from the original note were not included. 01.18.2024 Called patient per Staff msg below  Unable to leave vmail, well text msg sent for pt to return call to office to schedule appt  follow up appt Received: 4 days ago Linus Galas Neurology Scheduling And Referrals [place] Can you each out to schedule this patient- next available return with [facility]?  Thanks, Electrical engineer signed by Perrin Maltese R at 02/22/2022 12:18 PM EST  Plan of Treatment - documented as of this encounter Plan of Treatment - Upcoming Encounters Upcoming Encounters Date Type Department Care Team (Latest Contact Info)  07/27/2022 2:45 PM EDT Office Visit [facility]  [address] [place], [place] [address]  [phone]  [address], [place]  [place],  [address]  [phone] (Work)  [phone] (Fax)

## 2022-03-23 LAB — URINALYSIS, ROUTINE W REFLEX MICROSCOPIC
Bilirubin Urine: NEGATIVE
Glucose, UA: NEGATIVE
Hgb urine dipstick: NEGATIVE
Ketones, ur: NEGATIVE
Leukocytes,Ua: NEGATIVE
Nitrite: NEGATIVE
Protein, ur: NEGATIVE
Specific Gravity, Urine: 1.021 (ref 1.001–1.035)
pH: 6 (ref 5.0–8.0)

## 2022-03-23 LAB — HEMOGLOBIN A1C
Hgb A1c MFr Bld: 6.1 % of total Hgb — ABNORMAL HIGH (ref <5.7)
Mean Plasma Glucose: 128 mg/dL
eAG (mmol/L): 7.1 mmol/L

## 2022-03-23 LAB — COMPLETE METABOLIC PANEL WITH GFR
AG Ratio: 1.7 (calc) (ref 1.0–2.5)
ALT: 38 U/L (ref 9–46)
AST: 27 U/L (ref 10–35)
Albumin: 4.6 g/dL (ref 3.6–5.1)
Alkaline phosphatase (APISO): 83 U/L (ref 35–144)
BUN: 25 mg/dL (ref 7–25)
CO2: 24 mmol/L (ref 20–32)
Calcium: 10 mg/dL (ref 8.6–10.3)
Chloride: 105 mmol/L (ref 98–110)
Creat: 1.27 mg/dL (ref 0.70–1.30)
Globulin: 2.7 g/dL (calc) (ref 1.9–3.7)
Glucose, Bld: 85 mg/dL (ref 65–99)
Potassium: 4.2 mmol/L (ref 3.5–5.3)
Sodium: 138 mmol/L (ref 135–146)
Total Bilirubin: 0.5 mg/dL (ref 0.2–1.2)
Total Protein: 7.3 g/dL (ref 6.1–8.1)
eGFR: 68 mL/min/{1.73_m2} (ref >=60)

## 2022-03-23 LAB — MICROALBUMIN / CREATININE URINE RATIO
Creatinine, Urine: 185 mg/dL (ref 20–320)
Microalb Creat Ratio: 3 mcg/mg creat (ref <30)
Microalb, Ur: 0.5 mg/dL

## 2022-03-23 LAB — PSA: PSA: 0.88 ng/mL (ref <=4.00)

## 2022-03-26 NOTE — Assessment & Plan Note (Signed)
Encouraged him to stay on prescribed meds to avoid recurrent sinusitis

## 2022-03-26 NOTE — Assessment & Plan Note (Signed)
He has lost weight and reports sig diet/lifestyle efforts Recheck labs

## 2022-03-26 NOTE — Assessment & Plan Note (Signed)
Improved BP control after med changes - lisinopril increased again back to 40 mg daily and norvasc 5 mg started with reported urinary SE He also has worked on diet/lifestyle efforts BP Readings from Last 3 Encounters:  03/22/22 110/68  03/12/22 112/74  03/01/22 128/80  Continue lisinopril 40 mg daily Trial of holding norvasc or adjusting time of day that he takes it to see if urinary sx are related to norvasc If he decides to stop the med asked that he let us know and that we also confirm BP is still well controlled

## 2022-03-26 NOTE — Assessment & Plan Note (Signed)
Still pending full eval by his team - he did reconnect with them

## 2022-03-26 NOTE — Assessment & Plan Note (Signed)
Pending neuro f/up and scans

## 2022-04-02 ENCOUNTER — Ambulatory Visit: Payer: BC Managed Care – PPO | Admitting: Internal Medicine

## 2022-04-07 ENCOUNTER — Other Ambulatory Visit: Payer: Self-pay | Admitting: Nurse Practitioner

## 2022-04-07 DIAGNOSIS — I10 Essential (primary) hypertension: Secondary | ICD-10-CM

## 2022-04-09 ENCOUNTER — Ambulatory Visit: Payer: BC Managed Care – PPO | Admitting: Internal Medicine

## 2022-04-09 NOTE — Telephone Encounter (Signed)
Requested Prescriptions  Pending Prescriptions Disp Refills   lisinopril (ZESTRIL) 40 MG tablet [Pharmacy Med Name: LISINOPRIL '40MG'$  TABLETS] 90 tablet 0    Sig: TAKE 1 TABLET(40 MG) BY MOUTH DAILY     Cardiovascular:  ACE Inhibitors Passed - 04/07/2022  3:29 AM      Passed - Cr in normal range and within 180 days    Creat  Date Value Ref Range Status  03/22/2022 1.27 0.70 - 1.30 mg/dL Final   Creatinine, Urine  Date Value Ref Range Status  03/22/2022 185 20 - 320 mg/dL Final         Passed - K in normal range and within 180 days    Potassium  Date Value Ref Range Status  03/22/2022 4.2 3.5 - 5.3 mmol/L Final         Passed - Patient is not pregnant      Passed - Last BP in normal range    BP Readings from Last 1 Encounters:  03/22/22 110/68         Passed - Valid encounter within last 6 months    Recent Outpatient Visits           2 weeks ago Essential hypertension   [facility] Delsa Grana, PA-C   1 month ago Essential hypertension   [facility] Delsa Grana, PA-C   8 months ago Essential hypertension   [facility] Delsa Grana, PA-C   1 year ago Essential hypertension   [place], Julie F, FNP   1 year ago Essential hypertension   [facility] Delsa Grana, PA-C       Future Appointments             In 2 months Delsa Grana, [facility], [facility]

## 2022-04-11 ENCOUNTER — Other Ambulatory Visit: Payer: Self-pay | Admitting: Physician Assistant

## 2022-04-11 DIAGNOSIS — I10 Essential (primary) hypertension: Secondary | ICD-10-CM

## 2022-04-11 NOTE — Telephone Encounter (Signed)
Requested Prescriptions  Pending Prescriptions Disp Refills   amLODipine (NORVASC) 5 MG tablet [Pharmacy Med Name: AMLODIPINE BESYLATE '5MG'$  TABLETS] 90 tablet 0    Sig: TAKE 1 TABLET(5 MG) BY MOUTH DAILY     Cardiovascular: Calcium Channel Blockers 2 Passed - 04/11/2022  3:29 AM      Passed - Last BP in normal range    BP Readings from Last 1 Encounters:  03/22/22 110/68         Passed - Last Heart Rate in normal range    Pulse Readings from Last 1 Encounters:  03/22/22 81         Passed - Valid encounter within last 6 months    Recent Outpatient Visits           2 weeks ago Essential hypertension   [facility] Delsa Grana, PA-C   1 month ago Essential hypertension   [facility] Delsa Grana, PA-C   8 months ago Essential hypertension   [facility] Delsa Grana, PA-C   1 year ago Essential hypertension   [place], Julie F, FNP   1 year ago Essential hypertension   [facility] Delsa Grana, PA-C       Future Appointments             In 2 months Delsa Grana, [facility], [facility]

## 2022-04-23 ENCOUNTER — Ambulatory Visit: Payer: Self-pay | Admitting: Family Medicine

## 2022-04-23 ENCOUNTER — Encounter: Payer: Self-pay | Admitting: Family Medicine

## 2022-04-23 VITALS — BP 120/74 | HR 92 | Temp 98.3°F | Resp 16 | Ht 74.0 in | Wt 219.5 lb

## 2022-04-23 DIAGNOSIS — M5441 Lumbago with sciatica, right side: Secondary | ICD-10-CM

## 2022-04-23 MED ORDER — BACLOFEN 5 MG PO TABS: 5.0000 mg | ORAL_TABLET | Freq: Three times a day (TID) | ORAL | 1 refills | Status: DC | PRN

## 2022-04-23 MED ORDER — PANTOPRAZOLE SODIUM 40 MG PO TBEC: 40.0000 mg | DELAYED_RELEASE_TABLET | Freq: Two times a day (BID) | ORAL | 0 refills | Status: DC | PRN

## 2022-04-23 MED ORDER — PREDNISONE 20 MG PO TABS: 40.0000 mg | ORAL_TABLET | Freq: Every day | ORAL | 0 refills | Status: AC

## 2022-04-23 MED ORDER — IBUPROFEN 800 MG PO TABS: 800.0000 mg | ORAL_TABLET | Freq: Three times a day (TID) | ORAL | 0 refills | Status: DC
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
## 2022-04-25 ENCOUNTER — Ambulatory Visit: Payer: Self-pay

## 2022-04-25 ENCOUNTER — Encounter: Payer: Self-pay | Admitting: Family Medicine
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
## 2022-04-26 ENCOUNTER — Ambulatory Visit: Payer: BC Managed Care – PPO | Admitting: Internal Medicine

## 2022-04-27 ENCOUNTER — Ambulatory Visit
Admission: RE | Admit: 2022-04-27 | Discharge: 2022-04-27 | Disposition: A | Discharge status: Home / Self Care | Payer: BC Managed Care – PPO | Source: Ambulatory Visit | Attending: Family Medicine | Admitting: Family Medicine

## 2022-04-27 ENCOUNTER — Ambulatory Visit: Payer: BC Managed Care – PPO | Admitting: Family Medicine

## 2022-04-27 ENCOUNTER — Ambulatory Visit
Admission: RE | Admit: 2022-04-27 | Discharge: 2022-04-27 | Disposition: A | Discharge status: Home / Self Care | Payer: BC Managed Care – PPO | Attending: Family Medicine | Admitting: Family Medicine

## 2022-04-27 VITALS — BP 118/78 | HR 100 | Temp 98.6°F | Resp 18 | Ht 74.0 in | Wt 218.9 lb

## 2022-04-27 DIAGNOSIS — M5441 Lumbago with sciatica, right side: Secondary | ICD-10-CM | POA: Diagnosis present

## 2022-04-27 NOTE — Patient Instructions (Signed)
Get the xray done. You should hear from PT for an appointment. Talk to HR about FMLA and any disability policies you may have.
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
## 2022-05-01 ENCOUNTER — Encounter: Payer: Self-pay | Admitting: Family Medicine

## 2022-05-03 NOTE — Addendum Note (Signed)
Addended by: Delsa Grana on: 05/03/2022 04:28 PM   Modules accepted: Orders

## 2022-05-04 ENCOUNTER — Other Ambulatory Visit: Payer: Self-pay | Admitting: Family Medicine

## 2022-05-04 DIAGNOSIS — M5441 Lumbago with sciatica, right side: Secondary | ICD-10-CM

## 2022-05-07 ENCOUNTER — Other Ambulatory Visit: Payer: Self-pay | Admitting: Family Medicine

## 2022-05-07 DIAGNOSIS — Z5181 Encounter for therapeutic drug level monitoring: Secondary | ICD-10-CM

## 2022-05-07 DIAGNOSIS — E782 Mixed hyperlipidemia: Secondary | ICD-10-CM

## 2022-06-21 ENCOUNTER — Other Ambulatory Visit: Payer: Self-pay

## 2022-06-21 ENCOUNTER — Ambulatory Visit: Payer: BC Managed Care – PPO | Admitting: Family Medicine

## 2022-06-21 DIAGNOSIS — E782 Mixed hyperlipidemia: Secondary | ICD-10-CM

## 2022-06-21 DIAGNOSIS — Z5181 Encounter for therapeutic drug level monitoring: Secondary | ICD-10-CM

## 2022-06-21 DIAGNOSIS — I69354 Hemiplegia and hemiparesis following cerebral infarction affecting left non-dominant side: Secondary | ICD-10-CM

## 2022-06-21 DIAGNOSIS — I679 Cerebrovascular disease, unspecified: Secondary | ICD-10-CM

## 2022-06-21 MED ORDER — EZETIMIBE 10 MG PO TABS: 10.0000 mg | ORAL_TABLET | Freq: Every day | ORAL | 3 refills | Status: DC

## 2022-07-06 ENCOUNTER — Other Ambulatory Visit: Payer: Self-pay | Admitting: Family Medicine

## 2022-07-06 DIAGNOSIS — I10 Essential (primary) hypertension: Secondary | ICD-10-CM

## 2022-07-12 ENCOUNTER — Other Ambulatory Visit: Payer: Self-pay | Admitting: Family Medicine

## 2022-07-12 DIAGNOSIS — I10 Essential (primary) hypertension: Secondary | ICD-10-CM

## 2022-07-12 NOTE — Telephone Encounter (Signed)
Requested Prescriptions  Pending Prescriptions Disp Refills   amLODipine (NORVASC) 5 MG tablet [Pharmacy Med Name: AMLODIPINE BESYLATE 5MG  TABLETS] 90 tablet 0    Sig: TAKE 1 TABLET(5 MG) BY MOUTH DAILY     Cardiovascular: Calcium Channel Blockers 2 Passed - 07/12/2022  3:31 AM      Passed - Last BP in normal range    BP Readings from Last 1 Encounters:  04/27/22 118/78         Passed - Last Heart Rate in normal range    Pulse Readings from Last 1 Encounters:  04/27/22 100         Passed - Valid encounter within last 6 months    Recent Outpatient Visits           2 months ago Acute bilateral low back pain with right-sided sciatica   [facility] Danelle Berry, PA-C   2 months ago Acute bilateral low back pain with right-sided sciatica   [facility] Danelle Berry, PA-C   3 months ago Essential hypertension   [facility] Danelle Berry, PA-C   5 months ago Essential hypertension   [facility] Danelle Berry, [place]   11 months ago Essential hypertension   [facility] Danelle Berry, PA-C       Future Appointments             In 4 days Danelle Berry, PA-C [facility], [facility]

## 2022-07-16 ENCOUNTER — Ambulatory Visit: Payer: BC Managed Care – PPO | Admitting: Family Medicine

## 2022-07-23 ENCOUNTER — Encounter: Payer: Self-pay | Admitting: Family Medicine

## 2022-07-23 ENCOUNTER — Ambulatory Visit: Payer: BC Managed Care – PPO | Admitting: Family Medicine

## 2022-07-23 VITALS — BP 122/74 | HR 76 | Temp 98.0°F | Resp 16 | Ht 74.0 in | Wt 216.5 lb

## 2022-07-23 DIAGNOSIS — I679 Cerebrovascular disease, unspecified: Secondary | ICD-10-CM

## 2022-07-23 DIAGNOSIS — I10 Essential (primary) hypertension: Secondary | ICD-10-CM | POA: Diagnosis not present

## 2022-07-23 DIAGNOSIS — E782 Mixed hyperlipidemia: Secondary | ICD-10-CM

## 2022-07-23 DIAGNOSIS — R7303 Prediabetes: Secondary | ICD-10-CM | POA: Diagnosis not present

## 2022-07-23 DIAGNOSIS — Z5181 Encounter for therapeutic drug level monitoring: Secondary | ICD-10-CM | POA: Diagnosis not present

## 2022-07-23 DIAGNOSIS — I999 Unspecified disorder of circulatory system: Secondary | ICD-10-CM

## 2022-07-23 DIAGNOSIS — Z8673 Personal history of transient ischemic attack (TIA), and cerebral infarction without residual deficits: Secondary | ICD-10-CM

## 2022-07-23 DIAGNOSIS — J0141 Acute recurrent pansinusitis: Secondary | ICD-10-CM

## 2022-07-23 DIAGNOSIS — J302 Other seasonal allergic rhinitis: Secondary | ICD-10-CM

## 2022-07-23 DIAGNOSIS — J329 Chronic sinusitis, unspecified: Secondary | ICD-10-CM

## 2022-07-23 MED ORDER — AMOXICILLIN-POT CLAVULANATE 875-125 MG PO TABS
1.0000 | ORAL_TABLET | Freq: Two times a day (BID) | ORAL | 0 refills | Status: AC
Start: 2022-07-23 — End: 2022-08-02

## 2022-07-23 NOTE — Assessment & Plan Note (Signed)
He has been on zetia 10 mg and atorvastatin 40 Compliant with meds, no SE, no myalgias, fatigue or jaundice Due for FLP and recheck CMP Lab Results  Component Value Date   CHOL 121 08/11/2021   HDL 32 (L) 08/11/2021   LDLCALC 75 08/11/2021   TRIG 62 08/11/2021   CHOLHDL 3.8 08/11/2021  Diet and exercise recommendations for HLD reviewed and offered

## 2022-07-23 NOTE — Assessment & Plan Note (Signed)
He has been compliant with antihistamines, intranasal steroid sprays and singulair

## 2022-07-23 NOTE — Progress Notes (Signed)
Name: MARDARIUS DELBUONO   MRN: [id number]    DOB: Dec 18, 1970   Date:07/23/2022       Progress Note  Chief Complaint  Patient presents with   Follow-up   Hypertension    Subjective:   HEATHE JAKOBSEN is a 52 y.o. male, presents to clinic for routine f/up on chronic conditions  Hypertension:  Currently managed on lisinopril 40 and norvasc 5 Pt reports good med compliance and denies any SE.   Blood pressure today is well controlled. BP Readings from Last 3 Encounters:  07/23/22 122/74  04/27/22 118/78  04/23/22 120/74   Pt denies CP, SOB, exertional sx, LE edema, palpitation, Ha's, visual disturbances, lightheadedness, hypotension, syncope.   Hyperlipidemia: zetia and lipitor 40 Good med compliance Last Lipids: Lab Results  Component Value Date   CHOL 121 08/11/2021   HDL 32 (L) 08/11/2021   LDLCALC 75 08/11/2021   TRIG 62 08/11/2021   CHOLHDL 3.8 08/11/2021   - Denies: Chest pain, shortness of breath, myalgias, claudication  Chronic pain - recently stopped pain management and last Rx for oxycodone was in Feb. Back pain flare did improve with rest, heat, nsaids, muscle relaxers  Rhinosinusitis - on singulair, antihistamine and steroid nasal sprays but he reports acute worsening  Hx of stroke, vasculopathy? He is seeing neuro for fup the end of this week On statin, zetia and ASA       Current Outpatient Medications:    aspirin 81 MG chewable tablet, Chew by mouth., Disp: , Rfl:    atorvastatin (LIPITOR) 40 MG tablet, TAKE 1 TABLET(40 MG) BY MOUTH DAILY, Disp: 90 tablet, Rfl: 0   ezetimibe (ZETIA) 10 MG tablet, Take 1 tablet (10 mg total) by mouth daily., Disp: 90 tablet, Rfl: 3   lisinopril (ZESTRIL) 40 MG tablet, TAKE 1 TABLET(40 MG) BY MOUTH DAILY, Disp: 30 tablet, Rfl: 0   mometasone (NASONEX) 50 MCG/ACT nasal spray, Place 2 sprays into the nose daily., Disp: 1 each, Rfl: 12   montelukast (SINGULAIR) 10 MG tablet, Take 1 tablet (10 mg total) by mouth  at bedtime., Disp: 90 tablet, Rfl: 1   amLODipine (NORVASC) 5 MG tablet, TAKE 1 TABLET(5 MG) BY MOUTH DAILY (Patient not taking: Reported on 07/23/2022), Disp: 90 tablet, Rfl: 0   Baclofen 5 MG TABS, Take 1-2 tablets (5-10 mg total) by mouth 3 (three) times daily as needed (msk pain or spasms). (Patient not taking: Reported on 07/23/2022), Disp: 30 tablet, Rfl: 1   ibuprofen (ADVIL) 800 MG tablet, Take 1 tablet (800 mg total) by mouth 3 (three) times daily. (Patient not taking: Reported on 07/23/2022), Disp: 21 tablet, Rfl: 0   methocarbamol (ROBAXIN) 750 MG tablet, Take 750 mg by mouth 3 (three) times daily. (Patient not taking: Reported on 07/23/2022), Disp: , Rfl:    oxyCODONE-acetaminophen (PERCOCET) 10-325 MG tablet, TK 1 T PO 5 TIMES A DAY (Patient not taking: Reported on 03/22/2022), Disp: , Rfl:    pantoprazole (PROTONIX) 40 MG tablet, TAKE 1 TABLET BY MOUTH TWICE DAILY AS NEEDED FOR GI PROTECTION WITH STEROIDS AND NSAIDS (Patient not taking: Reported on 07/23/2022), Disp: 28 tablet, Rfl: 0  Patient Active Problem List   Diagnosis Date Noted   Screening for colon cancer 03/12/2022   Polyp of colon 03/12/2022   Narcotic dependence (HCC) 02/12/2022   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   Recurrent sinusitis 02/12/2022   Seasonal allergies 02/12/2022   Hemiplegia of left 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 (HCC) 12/10/2018  History of ischemic stroke 12/10/2018   Prediabetes 12/10/2018   Intracranial vascular stenosis 09/13/2018   Heart murmur 02/21/2015   Essential hypertension 02/21/2015   Mixed hyperlipidemia 02/21/2015    Past Surgical History:  Procedure Laterality Date   CLOSED REDUCTION SHOULDER DISLOCATION  06-2015   COLONOSCOPY WITH PROPOFOL N/A 03/12/2022   Procedure: COLONOSCOPY WITH PROPOFOL;  Surgeon: Midge Minium, MD;  Location: [facility];  Service: Endoscopy;  Laterality: N/A;   POLYPECTOMY  03/12/2022   Procedure: POLYPECTOMY;  Surgeon: Midge Minium, MD;   Location: [facility];  Service: Endoscopy;;   SHOULDER ARTHROSCOPY WITH OPEN ROTATOR CUFF REPAIR AND DISTAL CLAVICLE ACROMINECTOMY Right 06/16/2015   Procedure: right shoulder arthroscopic repair of labral tear;  Surgeon: Juanell Fairly, MD;  Location: [facility];  Service: Orthopedics;  Laterality: Right;    Family History  Problem Relation Age of Onset   Hypertension Mother    Hypertension Father    Diabetes Father    Diabetes Sister    Hypertension Sister     Social History   Tobacco Use   Smoking status: Never   Smokeless tobacco: Never  Vaping Use   Vaping Use: Never used  Substance Use Topics   Alcohol use: No    Alcohol/week: 0.0 standard drinks of alcohol   Drug use: No     No Known Allergies  Health Maintenance  Topic Date Due   COVID-19 Vaccine (3 - 2023-24 season) 08/08/2022 (Originally 10/06/2021)   Zoster Vaccines- Shingrix (1 of 2) 10/23/2022 (Originally 08/26/2020)   DTaP/Tdap/Td (2 - Td or Tdap) 04/24/2025   Colonoscopy  03/12/2029   Hepatitis C Screening  Completed   HIV Screening  Completed   HPV VACCINES  Aged Out   INFLUENZA VACCINE  Discontinued    Chart Review Today: I personally reviewed active problem list, medication list, allergies, family history, social history, health maintenance, notes from last encounter, lab results, imaging with the patient/caregiver today.   Review of Systems  Constitutional: Negative.   HENT: Negative.    Eyes: Negative.   Respiratory: Negative.    Cardiovascular: Negative.   Gastrointestinal: Negative.   Endocrine: Negative.   Genitourinary: Negative.   Musculoskeletal: Negative.   Skin: Negative.   Allergic/Immunologic: Negative.   Neurological: Negative.   Hematological: Negative.   Psychiatric/Behavioral: Negative.    All other systems reviewed and are negative.    Objective:   Vitals:   07/23/22 1325  BP: 122/74  Pulse: 76  Resp: 16  Temp: 98 F (36.7 C)  TempSrc: Oral  SpO2: 100%   Weight: 216 lb 8 oz (98.2 kg)  Height: 6\' 2"  (1.88 m)    Body mass index is 27.8 kg/m.  Physical Exam Vitals and nursing note reviewed.  Constitutional:      General: He is not in acute distress.    Appearance: Normal appearance. He is well-developed and well-groomed. He is not ill-appearing, toxic-appearing or diaphoretic.  HENT:     Head: Normocephalic and atraumatic.     Jaw: No trismus.     Right Ear: External ear normal.     Left Ear: External ear normal.     Nose: Nasal tenderness, mucosal edema and congestion present. No rhinorrhea.     Right Turbinates: Enlarged and swollen.     Left Turbinates: Enlarged and swollen.     Right Sinus: Maxillary sinus tenderness present. No frontal sinus tenderness.     Left Sinus: Maxillary sinus tenderness present. No frontal sinus tenderness.  Eyes:  General: Lids are normal. No scleral icterus.       Right eye: No discharge.        Left eye: No discharge.     Conjunctiva/sclera: Conjunctivae normal.  Neck:     Trachea: Trachea and phonation normal. No tracheal deviation.  Cardiovascular:     Rate and Rhythm: Normal rate and regular rhythm.     Pulses: Normal pulses.          Radial pulses are 2+ on the right side and 2+ on the left side.       Posterior tibial pulses are 2+ on the right side and 2+ on the left side.     Heart sounds: Normal heart sounds. No murmur heard.    No friction rub. No gallop.  Pulmonary:     Effort: Pulmonary effort is normal. No respiratory distress.     Breath sounds: Normal breath sounds. No stridor. No wheezing, rhonchi or rales.  Abdominal:     General: Bowel sounds are normal. There is no distension.     Palpations: Abdomen is soft.  Musculoskeletal:     Cervical back: Normal range of motion. No rigidity.     Right lower leg: No edema.     Left lower leg: No edema.  Skin:    General: Skin is warm and dry.     Coloration: Skin is not jaundiced.     Findings: No rash.     Nails: There is  no clubbing.  Neurological:     Mental Status: He is alert.     Cranial Nerves: No dysarthria or facial asymmetry.     Motor: No tremor or abnormal muscle tone.     Gait: Gait normal.  Psychiatric:        Mood and Affect: Mood normal.        Speech: Speech normal.        Behavior: Behavior normal. Behavior is cooperative.         Assessment & Plan:   Problem List Items Addressed This Visit       Cardiovascular and Mediastinum   Essential hypertension - Primary    Well controlled, compliant with meds, no SE or concerning sx Currently managed on lisinopril 40 and norvasc 5 daily BP Readings from Last 3 Encounters:  07/23/22 122/74  04/27/22 118/78  04/23/22 120/74  Recheck renal function and electrolytes Pt encouraged to continue to work on healthy diet (low salt) and lifestyle for improving HTN management - DASH diet handout offered today       Relevant Orders   COMPLETE METABOLIC PANEL WITH GFR   Intracranial vascular stenosis    He has neurology f/up this week      Vasculopathy   Relevant Orders   COMPLETE METABOLIC PANEL WITH GFR   Lipid panel     Respiratory   Recurrent sinusitis    Acute on chronic, pt has been on his daily medication but has acute worsening pain and pressure, very tender to bilateral maxillary sinuses  Augmentin to tx abs Recommend ENT consult with needing abx multiple times a year even when he is managing and compliant with all nasal/allergy meds      Relevant Medications   amoxicillin-clavulanate (AUGMENTIN) 875-125 MG tablet   Other Relevant Orders   Ambulatory referral to ENT     Other   Mixed hyperlipidemia    He has been on zetia 10 mg and atorvastatin 40 Compliant with meds, no SE, no myalgias, fatigue or  jaundice Due for FLP and recheck CMP Lab Results  Component Value Date   CHOL 121 08/11/2021   HDL 32 (L) 08/11/2021   LDLCALC 75 08/11/2021   TRIG 62 08/11/2021   CHOLHDL 3.8 08/11/2021  Diet and exercise  recommendations for HLD reviewed and offered       Relevant Orders   COMPLETE METABOLIC PANEL WITH GFR   Lipid panel   History of ischemic stroke    F/up with neurology      Prediabetes    Recheck labs since trending up even despite recent diet/lifestyle efforts and weight loss Not on meds Recent pertinent labs: Lab Results  Component Value Date   HGBA1C 6.1 (H) 03/22/2022   HGBA1C 5.8 (H) 08/11/2021   HGBA1C 5.8 (H) 12/06/2020          Relevant Orders   COMPLETE METABOLIC PANEL WITH GFR   Hemoglobin A1c   Seasonal allergies    He has been compliant with antihistamines, intranasal steroid sprays and singulair      Relevant Orders   Ambulatory referral to ENT   Other Visit Diagnoses     Encounter for medication monitoring       Relevant Orders   COMPLETE METABOLIC PANEL WITH GFR   Hemoglobin A1c   CBC with Differential/Platelet   Lipid panel   Acute recurrent pansinusitis       see above - con't daily meds and tx with abx   Relevant Medications   amoxicillin-clavulanate (AUGMENTIN) 875-125 MG tablet        Return in about 6 months (around 01/22/2023) for Routine follow-up.   Danelle Berry, PA-C 07/23/22 1:31 PM

## 2022-07-23 NOTE — Assessment & Plan Note (Signed)
Acute on chronic, pt has been on his daily medication but has acute worsening pain and pressure, very tender to bilateral maxillary sinuses  Augmentin to tx abs Recommend ENT consult with needing abx multiple times a year even when he is managing and compliant with all nasal/allergy meds

## 2022-07-23 NOTE — Assessment & Plan Note (Signed)
Well controlled, compliant with meds, no SE or concerning sx Currently managed on lisinopril 40 and norvasc 5 daily BP Readings from Last 3 Encounters:  07/23/22 122/74  04/27/22 118/78  04/23/22 120/74   Recheck renal function and electrolytes Pt encouraged to continue to work on healthy diet (low salt) and lifestyle for improving HTN management - DASH diet handout offered today

## 2022-07-23 NOTE — Assessment & Plan Note (Signed)
F/up with neurology

## 2022-07-23 NOTE — Assessment & Plan Note (Signed)
He has neurology f/up this week

## 2022-07-23 NOTE — Assessment & Plan Note (Signed)
Recheck labs since trending up even despite recent diet/lifestyle efforts and weight loss Not on meds Recent pertinent labs: Lab Results  Component Value Date   HGBA1C 6.1 (H) 03/22/2022   HGBA1C 5.8 (H) 08/11/2021   HGBA1C 5.8 (H) 12/06/2020

## 2022-07-24 ENCOUNTER — Ambulatory Visit: Payer: BC Managed Care – PPO | Admitting: Family Medicine

## 2022-07-24 LAB — CBC WITH DIFFERENTIAL/PLATELET
Absolute Monocytes: 238 cells/uL (ref 200–950)
Basophils Absolute: 51 cells/uL (ref 0–200)
Basophils Relative: 1.3 %
Eosinophils Absolute: 117 cells/uL (ref 15–500)
Eosinophils Relative: 3 %
HCT: 39.2 % (ref 38.5–50.0)
Hemoglobin: 13.2 g/dL (ref 13.2–17.1)
Lymphs Abs: 1338 cells/uL (ref 850–3900)
MCH: 27.6 pg (ref 27.0–33.0)
MCHC: 33.7 g/dL (ref 32.0–36.0)
MCV: 82 fL (ref 80.0–100.0)
MPV: 9.6 fL (ref 7.5–12.5)
Monocytes Relative: 6.1 %
Neutro Abs: 2157 cells/uL (ref 1500–7800)
Neutrophils Relative %: 55.3 %
Platelets: 229 10*3/uL (ref 140–400)
RBC: 4.78 10*6/uL (ref 4.20–5.80)
RDW: 12.5 % (ref 11.0–15.0)
Total Lymphocyte: 34.3 %
WBC: 3.9 10*3/uL (ref 3.8–10.8)

## 2022-07-24 LAB — COMPLETE METABOLIC PANEL WITH GFR
AG Ratio: 1.7 (calc) (ref 1.0–2.5)
ALT: 37 U/L (ref 9–46)
AST: 22 U/L (ref 10–35)
Albumin: 4.6 g/dL (ref 3.6–5.1)
Alkaline phosphatase (APISO): 86 U/L (ref 35–144)
BUN: 17 mg/dL (ref 7–25)
CO2: 28 mmol/L (ref 20–32)
Calcium: 9.9 mg/dL (ref 8.6–10.3)
Chloride: 104 mmol/L (ref 98–110)
Creat: 1.11 mg/dL (ref 0.70–1.30)
Globulin: 2.7 g/dL (calc) (ref 1.9–3.7)
Glucose, Bld: 108 mg/dL — ABNORMAL HIGH (ref 65–99)
Potassium: 4.4 mmol/L (ref 3.5–5.3)
Sodium: 138 mmol/L (ref 135–146)
Total Bilirubin: 0.6 mg/dL (ref 0.2–1.2)
Total Protein: 7.3 g/dL (ref 6.1–8.1)
eGFR: 80 mL/min/{1.73_m2} (ref >=60)

## 2022-07-24 LAB — LIPID PANEL
Cholesterol: 117 mg/dL (ref <200)
HDL: 38 mg/dL — ABNORMAL LOW (ref >=40)
LDL Cholesterol (Calc): 66 mg/dL (calc)
Non-HDL Cholesterol (Calc): 79 mg/dL (calc) (ref <130)
Total CHOL/HDL Ratio: 3.1 (calc) (ref <5.0)
Triglycerides: 58 mg/dL (ref <150)

## 2022-07-24 LAB — HEMOGLOBIN A1C
Hgb A1c MFr Bld: 5.8 % of total Hgb — ABNORMAL HIGH (ref <5.7)
Mean Plasma Glucose: 120 mg/dL
eAG (mmol/L): 6.6 mmol/L

## 2022-08-04 ENCOUNTER — Other Ambulatory Visit: Payer: Self-pay | Admitting: Family Medicine

## 2022-08-04 DIAGNOSIS — E782 Mixed hyperlipidemia: Secondary | ICD-10-CM

## 2022-08-04 DIAGNOSIS — Z5181 Encounter for therapeutic drug level monitoring: Secondary | ICD-10-CM

## 2022-09-06 ENCOUNTER — Other Ambulatory Visit: Payer: Self-pay | Admitting: Family Medicine

## 2022-09-06 DIAGNOSIS — I10 Essential (primary) hypertension: Secondary | ICD-10-CM

## 2022-09-06 MED ORDER — LISINOPRIL 40 MG PO TABS
40.0000 mg | ORAL_TABLET | Freq: Every day | ORAL | 0 refills | Status: DC
Start: 2022-09-06 — End: 2022-12-03

## 2022-10-25 ENCOUNTER — Other Ambulatory Visit: Payer: Self-pay | Admitting: Family Medicine

## 2022-10-25 ENCOUNTER — Encounter: Payer: Self-pay | Admitting: Family Medicine

## 2022-10-25 DIAGNOSIS — I10 Essential (primary) hypertension: Secondary | ICD-10-CM

## 2022-10-26 ENCOUNTER — Other Ambulatory Visit: Payer: Self-pay | Admitting: Family Medicine

## 2022-10-26 DIAGNOSIS — I10 Essential (primary) hypertension: Secondary | ICD-10-CM

## 2022-10-26 NOTE — Telephone Encounter (Signed)
Medication Refill - Medication: amLODipine (NORVASC) 5 MG tablet [[id number]]  DISCONTINUED   Pt reports that he took his last pill on Monday.  Has the patient contacted their pharmacy? Yes.   (Agent: If no, request that the patient contact the pharmacy for the refill. If patient does not wish to contact the pharmacy document the reason why and proceed with request.) (Agent: If yes, when and what did the pharmacy advise?)  Preferred Pharmacy (with phone number or street name):  [id number] - [place], [place] - [address] AT [facility] OF [address] Phone: [phone]  Fax: [phone]     Has the patient been seen for an appointment in the last year OR does the patient have an upcoming appointment? Yes.    Agent: Please be advised that RX refills may take up to 3 business days. We ask that you follow-up with your pharmacy.

## 2022-10-29 MED ORDER — AMLODIPINE BESYLATE 5 MG PO TABS: 5.0000 mg | ORAL_TABLET | Freq: Every day | ORAL | 0 refills | Status: DC

## 2022-10-29 NOTE — Telephone Encounter (Signed)
Copied from CRM [id number]. Topic: General - Inquiry >> Oct 29, 2022  9:32 AM Marlow Baars wrote: Reason for CRM: The patient called in inquiring about his request for amLODipine (NORVASC) 5 MG tablet [Pharmacy Med Name: AMLODIPINE BESYLATE 5MG  TABLETS]. The request states refill not appropriate. He states his provider prescribed that to go along with his Lisinopril 40 mg as he has a history of high blood pressure and strokes. He is worried about this. Please assist patient further.

## 2022-11-19 ENCOUNTER — Other Ambulatory Visit: Payer: Self-pay | Admitting: Family Medicine

## 2022-11-19 DIAGNOSIS — Z5181 Encounter for therapeutic drug level monitoring: Secondary | ICD-10-CM

## 2022-11-19 DIAGNOSIS — E782 Mixed hyperlipidemia: Secondary | ICD-10-CM

## 2022-12-03 ENCOUNTER — Other Ambulatory Visit: Payer: Self-pay | Admitting: Family Medicine

## 2022-12-03 ENCOUNTER — Telehealth: Payer: Self-pay | Admitting: Family Medicine

## 2022-12-03 DIAGNOSIS — I10 Essential (primary) hypertension: Secondary | ICD-10-CM

## 2022-12-03 NOTE — Telephone Encounter (Signed)
Copied from CRM [id number]. Topic: General - Other >> Dec 03, 2022  9:46 AM Phill Myron wrote: [facility] # [phone] option 2 reference claim # [id number] Fax# [phone] or email [email or web address] [employer] needs patients itemized bills starting April 16, 2022 to the present time    What is the status or can we speak with someone in [place] billing ,  ( also ,how long is this going to take)

## 2022-12-14 NOTE — Telephone Encounter (Signed)
Spoke with [place] and gave her the number to pro fee billing.

## 2023-01-22 ENCOUNTER — Ambulatory Visit: Payer: BC Managed Care – PPO | Admitting: Physician Assistant

## 2023-01-22 ENCOUNTER — Encounter: Payer: Self-pay | Admitting: Physician Assistant

## 2023-01-22 VITALS — BP 132/72 | HR 89 | Resp 16 | Ht 74.0 in | Wt 223.0 lb

## 2023-01-22 DIAGNOSIS — R7303 Prediabetes: Secondary | ICD-10-CM

## 2023-01-22 DIAGNOSIS — N529 Male erectile dysfunction, unspecified: Secondary | ICD-10-CM | POA: Diagnosis not present

## 2023-01-22 DIAGNOSIS — E782 Mixed hyperlipidemia: Secondary | ICD-10-CM

## 2023-01-22 DIAGNOSIS — I10 Essential (primary) hypertension: Secondary | ICD-10-CM

## 2023-01-22 DIAGNOSIS — Z0000 Encounter for general adult medical examination without abnormal findings: Secondary | ICD-10-CM

## 2023-01-22 DIAGNOSIS — Z1329 Encounter for screening for other suspected endocrine disorder: Secondary | ICD-10-CM

## 2023-01-22 DIAGNOSIS — Z7189 Other specified counseling: Secondary | ICD-10-CM

## 2023-01-22 MED ORDER — LISINOPRIL 40 MG PO TABS: 40.0000 mg | ORAL_TABLET | Freq: Every day | ORAL | 0 refills | Status: DC

## 2023-01-22 MED ORDER — TADALAFIL 5 MG PO TABS: 5.0000 mg | ORAL_TABLET | Freq: Every day | ORAL | 2 refills | Status: AC | PRN

## 2023-01-22 MED ORDER — AMLODIPINE BESYLATE 5 MG PO TABS: 5.0000 mg | ORAL_TABLET | Freq: Every day | ORAL | 0 refills | Status: DC

## 2023-01-22 NOTE — Progress Notes (Unsigned)
Annual Physical Exam   Name: Charles Alvarado   MRN: [id number]    DOB: 01/05/1971   Date:01/22/2023  Today's Provider: Jacquelin Hawking, MHS, PA-C Introduced myself to the patient as a PA-C and provided education on APPs in clinical practice.         Subjective  Chief Complaint  Chief Complaint  Patient presents with   Medical Management of Chronic Issues   Hypertension    HPI  Patient presents for annual CPE .   Diet: He reports his diet is well balanced with protein, carbs, fats and vegetables. He has been trying to eat salads every day for lunch - past 11 months he has been consistent with this  Exercise: He is doing weight training 4 times per week, for an hour each session  Sleep: "not great" - he has not completed sleep study. Has sleep maintenance issues. He has been told that he snores while sleeping. Getting about 6 hours per night  Mood: "everything's fine"    IPSS Questionnaire (AUA-7): Over the past month.   1)  How often have you had a sensation of not emptying your bladder completely after you finish urinating?  0 - Not at all  2)  How often have you had to urinate again less than two hours after you finished urinating? 0 - Not at all  3)  How often have you found you stopped and started again several times when you urinated?  0 - Not at all  4) How difficult have you found it to postpone urination?  1 - Less than 1 time in 5  5) How often have you had a weak urinary stream?  0 - Not at all  6) How often have you had to push or strain to begin urination?  0 - Not at all  7) How many times did you most typically get up to urinate from the time you went to bed until the time you got up in the morning?  1 - 1 time  Total score:  0-7 mildly symptomatic   8-19 moderately symptomatic   20-35 severely symptomatic        HYPERTENSION / HYPERLIPIDEMIA Satisfied with current treatment? yes Duration of hypertension: years BP monitoring frequency: rarely BP  range: 130s/80s BP medication side effects: no  BP meds: Amlodipine 5 mg PO every day, Lisinopril 40 mg PO QD Duration of hyperlipidemia: years Cholesterol medication side effects: no Cholesterol supplements: none  cholesterol medications: atorvastain (lipitor) and ezetimide (zetia) Medication compliance: good compliance Aspirin: yes Recent stressors: no    Prediabetes Most recent A1c was 5.8%     Hx of stroke/ Hemiplegia  Most recently seen by neurology 09/26/22 - reviewed office visit note  He reports he has been released from Neurology for stroke surveillance  HE reports his left foot is sometimes not as sure as right- reports it will "fall asleep" randomly    He has concerns for erectile dysfunction States he is having difficulties with initiating and maintaining erections Reports this has been ongoing for about 4 months - states it is progressively getting worse        Depression: phq 9 is negative    07/23/2022    1:25 PM 04/27/2022    2:57 PM 04/27/2022    2:54 PM 04/23/2022   11:37 AM 03/22/2022   10:08 AM  Depression screen PHQ 2/9  Decreased Interest 0 0 0 0 0  Down, Depressed, Hopeless  0 0 0 0 0  PHQ - 2 Score 0 0 0 0 0  Altered sleeping 0 0 0 0 0  Tired, decreased energy 0 0 0 0 0  Change in appetite 0 0 0 0 0  Feeling bad or failure about yourself  0 0 0 0 0  Trouble concentrating 0 0 0 0 0  Moving slowly or fidgety/restless 0 0 0 0 0  Suicidal thoughts 0 0 0 0 0  PHQ-9 Score 0 0 0 0 0  Difficult doing work/chores Not difficult at all Not difficult at all Not difficult at all Not difficult at all Not difficult at all    Hypertension:  BP Readings from Last 3 Encounters:  01/22/23 132/72  07/23/22 122/74  04/27/22 118/78    Obesity: Wt Readings from Last 3 Encounters:  01/22/23 223 lb (101.2 kg)  07/23/22 216 lb 8 oz (98.2 kg)  04/27/22 218 lb 14.4 oz (99.3 kg)   BMI Readings from Last 3 Encounters:  01/22/23 28.63 kg/m  07/23/22 27.80  kg/m  04/27/22 28.11 kg/m     Lipids:  Lab Results  Component Value Date   CHOL 120 01/22/2023   CHOL 117 07/23/2022   CHOL 121 08/11/2021   Lab Results  Component Value Date   HDL 36 (L) 01/22/2023   HDL 38 (L) 07/23/2022   HDL 32 (L) 08/11/2021   Lab Results  Component Value Date   LDLCALC 69 01/22/2023   LDLCALC 66 07/23/2022   LDLCALC 75 08/11/2021   Lab Results  Component Value Date   TRIG 72 01/22/2023   TRIG 58 07/23/2022   TRIG 62 08/11/2021   Lab Results  Component Value Date   CHOLHDL 3.3 01/22/2023   CHOLHDL 3.1 07/23/2022   CHOLHDL 3.8 08/11/2021   No results found for: "LDLDIRECT" Glucose:  Glucose, Bld  Date Value Ref Range Status  01/22/2023 100 (H) 65 - 99 mg/dL Final    Comment:    .            Fasting reference interval . For someone without known diabetes, a glucose value between 100 and 125 mg/dL is consistent with prediabetes and should be confirmed with a follow-up test. .   07/23/2022 108 (H) 65 - 99 mg/dL Final    Comment:    .            Fasting reference interval . For someone without known diabetes, a glucose value between 100 and 125 mg/dL is consistent with prediabetes and should be confirmed with a follow-up test. .   03/22/2022 85 65 - 99 mg/dL Final    Comment:    .            Fasting reference interval .     Flowsheet Row [facility] Visit from 12/26/2020 in [facility]  AUDIT-C Score 0        Married STD testing and prevention (HIV/chl/gon/syphilis):  no, declines screening today   Skin cancer: Discussed monitoring for atypical lesions  Prostate cancer screening:  ordered Lab Results  Component Value Date   PSA 0.88 01/22/2023   PSA 0.88 03/22/2022    Lung cancer:  Low Dose CT Chest recommended if Age 58-80 years, 30 pack-year currently smoking OR have quit w/in 15years. Patient  not applicable AAA: The USPSTF recommends one-time screening with ultrasonography in men ages  33 to 75 years who have ever smoked. Patient:  not applicable ECG:  NA  Health Maintenance  Topic Date Due   COVID-19 Vaccine (3 - 2024-25 season) 02/07/2023*   Zoster (Shingles) Vaccine (1 of 2) 04/21/2023*   DTaP/Tdap/Td vaccine (2 - Td or Tdap) 04/24/2025   Colon Cancer Screening  03/12/2029   Hepatitis C Screening  Completed   HIV Screening  Completed   HPV Vaccine  Aged Out   Flu Shot  Discontinued  *Topic was postponed. The date shown is not the original due date.     Advanced Care Planning: A voluntary discussion about advance care planning including the explanation and discussion of advance directives.  Discussed health care proxy and Living will, and the patient was able to identify a health care proxy as no one, currently but he would like to designate his wife.  Patient does not have a living will in effect at this time.  Patient Active Problem List   Diagnosis Date Noted   Advanced care planning/counseling discussion 01/22/2023   Erectile dysfunction 01/22/2023   Screening for colon cancer 03/12/2022   Polyp of colon 03/12/2022   Narcotic dependence (HCC) 02/12/2022   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   Recurrent sinusitis 02/12/2022   Seasonal allergies 02/12/2022   Hemiplegia of left 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 (HCC) 12/10/2018   History of ischemic stroke 12/10/2018   Prediabetes 12/10/2018   Intracranial vascular stenosis 09/13/2018   Heart murmur 02/21/2015   Essential hypertension 02/21/2015   Mixed hyperlipidemia 02/21/2015    Past Surgical History:  Procedure Laterality Date   CLOSED REDUCTION SHOULDER DISLOCATION  06-2015   COLONOSCOPY WITH PROPOFOL N/A 03/12/2022   Procedure: COLONOSCOPY WITH PROPOFOL;  Surgeon: Midge Minium, MD;  Location: [facility];  Service: Endoscopy;  Laterality: N/A;   POLYPECTOMY  03/12/2022   Procedure: POLYPECTOMY;  Surgeon: Midge Minium, MD;  Location: [facility];  Service: Endoscopy;;    SHOULDER ARTHROSCOPY WITH OPEN ROTATOR CUFF REPAIR AND DISTAL CLAVICLE ACROMINECTOMY Right 06/16/2015   Procedure: right shoulder arthroscopic repair of labral tear;  Surgeon: Juanell Fairly, MD;  Location: [facility];  Service: Orthopedics;  Laterality: Right;    Family History  Problem Relation Age of Onset   Hypertension Mother    Hypertension Father    Diabetes Father    Diabetes Sister    Hypertension Sister     Social History   Socioeconomic History   Marital status: Married    Spouse name: Marylene Land   Number of children: 0   Years of education: Not on file   Highest education level: Some college, no degree  Occupational History   Occupation: Architectural technologist  Tobacco Use   Smoking status: Never   Smokeless tobacco: Never  Vaping Use   Vaping status: Never Used  Substance and Sexual Activity   Alcohol use: No    Alcohol/week: 0.0 standard drinks of alcohol   Drug use: No   Sexual activity: Yes    Partners: Female  Other Topics Concern   Not on file  Social History Narrative   Not on file   Social Drivers of Health   Financial Resource Strain: Low Risk  (04/27/2022)   Overall Financial Resource Strain (CARDIA)    Difficulty of Paying Living Expenses: Not hard at all  Food Insecurity: No Food Insecurity (04/27/2022)   Hunger Vital Sign    Worried About Running Out of Food in the Last Year: Never true    Ran Out of Food in the Last Year: Never true  Transportation Needs: No Transportation Needs (04/27/2022)   PRAPARE -  Administrator, Civil Service (Medical): No    Lack of Transportation (Non-Medical): No  Physical Activity: Inactive (09/18/2018)   Exercise Vital Sign    Days of Exercise per Week: 0 days    Minutes of Exercise per Session: 0 min  Stress: No Stress Concern Present (04/27/2022)   [employer] of Occupational Health - Occupational Stress Questionnaire    Feeling of Stress : Not at all  Social Connections: Socially Integrated (04/27/2022)    Social Connection and Isolation Panel [NHANES]    Frequency of Communication with Friends and Family: Three times a week    Frequency of Social Gatherings with Friends and Family: Three times a week    Attends Religious Services: More than 4 times per year    Active Member of Clubs or Organizations: Yes    Attends Banker Meetings: More than 4 times per year    Marital Status: Married  Catering manager Violence: Not At Risk (09/18/2018)   Humiliation, Afraid, Rape, and Kick questionnaire    Fear of Current or Ex-Partner: No    Emotionally Abused: No    Physically Abused: No    Sexually Abused: No     Current Outpatient Medications:    aspirin 81 MG chewable tablet, Chew by mouth., Disp: , Rfl:    atorvastatin (LIPITOR) 40 MG tablet, TAKE 1 TABLET(40 MG) BY MOUTH DAILY, Disp: 90 tablet, Rfl: 2   ezetimibe (ZETIA) 10 MG tablet, Take 1 tablet (10 mg total) by mouth daily., Disp: 90 tablet, Rfl: 3   mometasone (NASONEX) 50 MCG/ACT nasal spray, Place 2 sprays into the nose daily., Disp: 1 each, Rfl: 12   montelukast (SINGULAIR) 10 MG tablet, Take 1 tablet (10 mg total) by mouth at bedtime., Disp: 90 tablet, Rfl: 1   tadalafil (CIALIS) 5 MG tablet, Take 1 tablet (5 mg total) by mouth daily as needed for erectile dysfunction., Disp: 10 tablet, Rfl: 2   amLODipine (NORVASC) 5 MG tablet, Take 1 tablet (5 mg total) by mouth daily., Disp: 90 tablet, Rfl: 0   lisinopril (ZESTRIL) 40 MG tablet, Take 1 tablet (40 mg total) by mouth daily., Disp: 90 tablet, Rfl: 0  No Known Allergies   Review of Systems  Constitutional:  Negative for chills, fever, malaise/fatigue and weight loss.  HENT:  Negative for hearing loss, nosebleeds, sore throat and tinnitus.   Eyes:  Negative for blurred vision, double vision and photophobia.  Respiratory:  Negative for cough, shortness of breath and wheezing.   Cardiovascular:  Negative for chest pain, palpitations and leg swelling.  Gastrointestinal:   Positive for heartburn (occassional from trigger foods- usually relieved with TUMS). Negative for blood in stool, constipation, diarrhea, nausea and vomiting.  Genitourinary:  Negative for dysuria and frequency.  Musculoskeletal:  Negative for falls, joint pain and myalgias.  Skin:  Negative for itching and rash.  Neurological:  Positive for tingling (left foot - intermittent after CVA) and headaches (ocassional). Negative for dizziness, tremors, focal weakness, loss of consciousness and weakness.  Psychiatric/Behavioral:  Negative for depression, memory loss and suicidal ideas. The patient is not nervous/anxious and does not have insomnia.        Objective  Vitals:   01/22/23 0832  BP: 132/72  Pulse: 89  Resp: 16  SpO2: 98%  Weight: 223 lb (101.2 kg)  Height: 6\' 2"  (1.88 m)    Body mass index is 28.63 kg/m.  Physical Exam Vitals reviewed.  Constitutional:  General: He is awake.     Appearance: Normal appearance. He is well-developed and well-groomed.  HENT:     Head: Normocephalic and atraumatic.     Right Ear: Hearing, tympanic membrane and ear canal normal.     Left Ear: Hearing, tympanic membrane and ear canal normal.     Nose: Nose normal.     Mouth/Throat:     Lips: Pink.     Mouth: Mucous membranes are moist. No lacerations or oral lesions.     Pharynx: Oropharynx is clear. Uvula midline. No pharyngeal swelling, oropharyngeal exudate or posterior oropharyngeal erythema.  Eyes:     General: Lids are normal. Gaze aligned appropriately.     Extraocular Movements: Extraocular movements intact.     Right eye: Normal extraocular motion and no nystagmus.     Left eye: Normal extraocular motion and no nystagmus.     Conjunctiva/sclera: Conjunctivae normal.     Pupils: Pupils are equal, round, and reactive to light.  Neck:     Thyroid: No thyroid mass, thyromegaly or thyroid tenderness.     Trachea: Phonation normal.  Cardiovascular:     Rate and Rhythm: Normal  rate and regular rhythm.     Pulses: Normal pulses.          Radial pulses are 2+ on the right side and 2+ on the left side.     Heart sounds: Normal heart sounds. No murmur heard.    No friction rub. No gallop.  Pulmonary:     Effort: Pulmonary effort is normal.     Breath sounds: Normal breath sounds. No decreased air movement. No decreased breath sounds, wheezing, rhonchi or rales.  Abdominal:     General: Abdomen is flat. Bowel sounds are normal.     Palpations: Abdomen is soft.     Tenderness: There is no abdominal tenderness.  Musculoskeletal:     Cervical back: Normal range of motion and neck supple.     Right lower leg: No edema.     Left lower leg: No edema.  Lymphadenopathy:     Head:     Right side of head: No submental, submandibular or preauricular adenopathy.     Left side of head: No submental, submandibular or preauricular adenopathy.     Cervical: No cervical adenopathy.     Right cervical: No superficial or posterior cervical adenopathy.    Left cervical: No superficial or posterior cervical adenopathy.     Upper Body:     Right upper body: No supraclavicular adenopathy.     Left upper body: No supraclavicular adenopathy.  Skin:    General: Skin is warm and dry.  Neurological:     General: No focal deficit present.     Mental Status: He is alert and oriented to person, place, and time. Mental status is at baseline.     GCS: GCS eye subscore is 4. GCS verbal subscore is 5. GCS motor subscore is 6.     Cranial Nerves: No cranial nerve deficit, dysarthria or facial asymmetry.     Motor: No weakness, tremor, atrophy or abnormal muscle tone.     Gait: Gait is intact.     Deep Tendon Reflexes:     Reflex Scores:      Patellar reflexes are 2+ on the right side and 2+ on the left side. Psychiatric:        Attention and Perception: Attention and perception normal.        Mood and Affect: Mood  and affect normal.        Speech: Speech normal.        Behavior: Behavior  normal. Behavior is cooperative.        Thought Content: Thought content normal.        Cognition and Memory: Cognition normal.        Judgment: Judgment normal.      Recent Results (from the past 2160 hours)  TSH     Status: None   Collection Time: 01/22/23  9:12 AM  Result Value Ref Range   TSH 2.56 0.40 - 4.50 mIU/L  Hemoglobin A1c     Status: Abnormal   Collection Time: 01/22/23  9:12 AM  Result Value Ref Range   Hgb A1c MFr Bld 6.1 (H) <5.7 % of total Hgb    Comment: For someone without known diabetes, a hemoglobin  A1c value between 5.7% and 6.4% is consistent with prediabetes and should be confirmed with a  follow-up test. . For someone with known diabetes, a value <7% indicates that their diabetes is well controlled. A1c targets should be individualized based on duration of diabetes, age, comorbid conditions, and other considerations. . This assay result is consistent with an increased risk of diabetes. . Currently, no consensus exists regarding use of hemoglobin A1c for diagnosis of diabetes for children. .    Mean Plasma Glucose 128 mg/dL   eAG (mmol/L) 7.1 mmol/L  Lipid panel     Status: Abnormal   Collection Time: 01/22/23  9:12 AM  Result Value Ref Range   Cholesterol 120 <200 mg/dL   HDL 36 (L) > OR = 40 mg/dL   Triglycerides 72 <161 mg/dL   LDL Cholesterol (Calc) 69 mg/dL (calc)    Comment: Reference range: <100 . Desirable range <100 mg/dL for primary prevention;   <70 mg/dL for patients with CHD or diabetic patients  with > or = 2 CHD risk factors. Marland Kitchen LDL-C is now calculated using the Martin-Hopkins  calculation, which is a validated novel method providing  better accuracy than the Friedewald equation in the  estimation of LDL-C.  Horald Pollen et al. Lenox Ahr. 0960;454(09): 2061-2068  (http://education.[facility].com/faq/FAQ164)    Total CHOL/HDL Ratio 3.3 <5.0 (calc)   Non-HDL Cholesterol (Calc) 84 <811 mg/dL (calc)    Comment: For patients  with diabetes plus 1 major ASCVD risk  factor, treating to a non-HDL-C goal of <100 mg/dL  (LDL-C of <91 mg/dL) is considered a therapeutic  option.   CBC with Differential/Platelet     Status: Abnormal   Collection Time: 01/22/23  9:12 AM  Result Value Ref Range   WBC 4.2 3.8 - 10.8 Thousand/uL   RBC 4.62 4.20 - 5.80 Million/uL   Hemoglobin 12.7 (L) 13.2 - 17.1 g/dL   HCT 47.8 (L) 29.5 - 62.1 %   MCV 81.6 80.0 - 100.0 fL   MCH 27.5 27.0 - 33.0 pg   MCHC 33.7 32.0 - 36.0 g/dL    Comment: For adults, a slight decrease in the calculated MCHC value (in the range of 30 to 32 g/dL) is most likely not clinically significant; however, it should be interpreted with caution in correlation with other red cell parameters and the patient's clinical condition.    RDW 12.5 11.0 - 15.0 %   Platelets 225 140 - 400 Thousand/uL   MPV 9.5 7.5 - 12.5 fL   Neutro Abs 2,071 1,500 - 7,800 cells/uL   Absolute Lymphocytes 1,525 850 - 3,900 cells/uL   Absolute Monocytes 344  200 - 950 cells/uL   Eosinophils Absolute 202 15 - 500 cells/uL   Basophils Absolute 59 0 - 200 cells/uL   Neutrophils Relative % 49.3 %   Total Lymphocyte 36.3 %   Monocytes Relative 8.2 %   Eosinophils Relative 4.8 %   Basophils Relative 1.4 %  COMPLETE METABOLIC PANEL WITH GFR     Status: Abnormal   Collection Time: 01/22/23  9:12 AM  Result Value Ref Range   Glucose, Bld 100 (H) 65 - 99 mg/dL    Comment: .            Fasting reference interval . For someone without known diabetes, a glucose value between 100 and 125 mg/dL is consistent with prediabetes and should be confirmed with a follow-up test. .    BUN 14 7 - 25 mg/dL   Creat 5.62 1.30 - 8.65 mg/dL   eGFR 78 > OR = 60 HQ/ION/6.29B2   BUN/Creatinine Ratio SEE NOTE: 6 - 22 (calc)    Comment:    Not Reported: BUN and Creatinine are within    reference range. .    Sodium 140 135 - 146 mmol/L   Potassium 3.8 3.5 - 5.3 mmol/L   Chloride 105 98 - 110 mmol/L    CO2 29 20 - 32 mmol/L   Calcium 9.8 8.6 - 10.3 mg/dL   Total Protein 6.9 6.1 - 8.1 g/dL   Albumin 4.4 3.6 - 5.1 g/dL   Globulin 2.5 1.9 - 3.7 g/dL (calc)   AG Ratio 1.8 1.0 - 2.5 (calc)   Total Bilirubin 0.4 0.2 - 1.2 mg/dL   Alkaline phosphatase (APISO) 88 35 - 144 U/L   AST 24 10 - 35 U/L   ALT 33 9 - 46 U/L  PSA     Status: None   Collection Time: 01/22/23  9:12 AM  Result Value Ref Range   PSA 0.88 < OR = 4.00 ng/mL    Comment: The total PSA value from this assay system is  standardized against the WHO standard. The test  result will be approximately 20% lower when compared  to the equimolar-standardized total PSA (Beckman  Coulter). Comparison of serial PSA results should be  interpreted with this fact in mind. . This test was performed using the Siemens  chemiluminescent method. Values obtained from  different assay methods cannot be used interchangeably. PSA levels, regardless of value, should not be interpreted as absolute evidence of the presence or absence of disease.      Fall Risk:    01/22/2023    8:32 AM 07/23/2022    1:25 PM 04/27/2022    2:57 PM 04/27/2022    2:54 PM 04/23/2022   11:37 AM  Fall Risk   Falls in the past year? 0 0 0 0 0  Number falls in past yr: 0 0 0 0 0  Injury with Fall? 0 0 0 0 0  Risk for fall due to : No Fall Risks No Fall Risks  No Fall Risks No Fall Risks  Follow up Falls prevention discussed Falls prevention discussed;Education provided;Falls evaluation completed  Falls prevention discussed;Education provided;Falls evaluation completed Falls prevention discussed;Education provided;Falls evaluation completed     Functional Status Survey:      Assessment & Plan  Problem List Items Addressed This Visit       Cardiovascular and Mediastinum   Essential hypertension   Chronic, historic condition BP is close to goal today in office  Currently taking  Amlodipine 5 mg  PO every day, Lisinopril 40 mg PO every day and appears to be  tolerating well Continue current regimen Refills provided today  Follow up in 6 months or sooner if concerns arise        Relevant Medications   amLODipine (NORVASC) 5 MG tablet   lisinopril (ZESTRIL) 40 MG tablet   tadalafil (CIALIS) 5 MG tablet     Other   Mixed hyperlipidemia   Chronic, historic  condition Appears to be tolerating current regimen well- currently taking Zetia 10 mg PO every day and Atorvastatin 40 mg PO every day  Recheck lipid panel today Results to dictate further management  Follow up in 6 months or sooner if concerns arise        Relevant Medications   amLODipine (NORVASC) 5 MG tablet   lisinopril (ZESTRIL) 40 MG tablet   tadalafil (CIALIS) 5 MG tablet   Other Relevant Orders   Lipid panel (Completed)   Prediabetes   Most recent A1c was 5.8%  Recheck A1c  Results to dictate further management        Relevant Orders   Hemoglobin A1c (Completed)   Advanced care planning/counseling discussion   A voluntary discussion about advance care planning including the explanation and discussion of advance directives was extensively discussed  with the patient for 3  minutes with patient and myself  present.  Explanation about the health care proxy and Living will was reviewed and packet with forms with explanation of how to fill them out was given.  During this discussion, the patient was able to identify a health care proxy as  his wife  and plans to fill out the paperwork required.  Patient was offered a separate Advance Care Planning visit for further assistance with forms.         Erectile dysfunction   Acute on chronic, ongoing Patient reports issues with initiating and maintaining an erection especially since his Stroke Reports severity is progressively worsening Will try Tadalafil 5 mg PO once per day PRN for assistance with this We reviewed that hyperglycemia, HTN, DM can also contribute to this concern and we will recheck A1c and CMP today Results to  dictate further management  Follow up in 6 months or sooner if concerns arise        Relevant Medications   tadalafil (CIALIS) 5 MG tablet   Other Visit Diagnoses       Annual physical exam    -  Primary   Relevant Orders   TSH (Completed)   Hemoglobin A1c (Completed)   Lipid panel (Completed)   CBC with Differential/Platelet (Completed)   COMPLETE METABOLIC PANEL WITH GFR (Completed)   PSA (Completed)     Screening for thyroid disorder       Relevant Orders   TSH (Completed)        -Prostate cancer screening and PSA options (with potential risks and benefits of testing vs not testing) were discussed along with recent recs/guidelines. -USPSTF grade A and B recommendations reviewed with patient; age-appropriate recommendations, preventive care, screening tests, etc discussed and encouraged; healthy living encouraged; see AVS for patient education given to patient -Discussed importance of 150 minutes of physical activity weekly, eat two servings of fish weekly, eat one serving of tree nuts ( cashews, pistachios, pecans, almonds.Marland Kitchen) every other day, eat 6 servings of fruit/vegetables daily and drink plenty of water and avoid sweet beverages.  -Reviewed Health Maintenance: yes   Return in about 6 months (around 07/23/2023) for HLD, HTN, hx  CVA, prediabetes.   I, Angle Dirusso E Efrata Brunner, PA-C, have reviewed all documentation for this visit. The documentation on 01/24/23 for the exam, diagnosis, procedures, and orders are all accurate and complete.   Jacquelin Hawking, MHS, PA-C [facility]

## 2023-01-22 NOTE — Assessment & Plan Note (Signed)
Chronic, historic  condition Appears to be tolerating current regimen well- currently taking Zetia 10 mg PO every day and Atorvastatin 40 mg PO every day  Recheck lipid panel today Results to dictate further management  Follow up in 6 months or sooner if concerns arise

## 2023-01-22 NOTE — Patient Instructions (Signed)
It was nice to meet you and I appreciate the opportunity to be involved in your care If you were satisfied with the care you received from me, I would greatly appreciate you saying so in the after-visit survey that is sent out following our visit.   

## 2023-01-22 NOTE — Assessment & Plan Note (Signed)
A voluntary discussion about advance care planning including the explanation and discussion of advance directives was extensively discussed  with the patient for 3  minutes with patient and myself  present.  Explanation about the health care proxy and Living will was reviewed and packet with forms with explanation of how to fill them out was given.  During this discussion, the patient was able to identify a health care proxy as  his wife  and plans to fill out the paperwork required.  Patient was offered a separate Advance Care Planning visit for further assistance with forms.

## 2023-01-23 LAB — CBC WITH DIFFERENTIAL/PLATELET
Absolute Lymphocytes: 1525 {cells}/uL (ref 850–3900)
Absolute Monocytes: 344 {cells}/uL (ref 200–950)
Basophils Absolute: 59 {cells}/uL (ref 0–200)
Basophils Relative: 1.4 %
Eosinophils Absolute: 202 {cells}/uL (ref 15–500)
Eosinophils Relative: 4.8 %
HCT: 37.7 % — ABNORMAL LOW (ref 38.5–50.0)
Hemoglobin: 12.7 g/dL — ABNORMAL LOW (ref 13.2–17.1)
MCH: 27.5 pg (ref 27.0–33.0)
MCHC: 33.7 g/dL (ref 32.0–36.0)
MCV: 81.6 fL (ref 80.0–100.0)
MPV: 9.5 fL (ref 7.5–12.5)
Monocytes Relative: 8.2 %
Neutro Abs: 2071 {cells}/uL (ref 1500–7800)
Neutrophils Relative %: 49.3 %
Platelets: 225 10*3/uL (ref 140–400)
RBC: 4.62 10*6/uL (ref 4.20–5.80)
RDW: 12.5 % (ref 11.0–15.0)
Total Lymphocyte: 36.3 %
WBC: 4.2 10*3/uL (ref 3.8–10.8)

## 2023-01-23 LAB — COMPLETE METABOLIC PANEL WITH GFR
AG Ratio: 1.8 (calc) (ref 1.0–2.5)
ALT: 33 U/L (ref 9–46)
AST: 24 U/L (ref 10–35)
Albumin: 4.4 g/dL (ref 3.6–5.1)
Alkaline phosphatase (APISO): 88 U/L (ref 35–144)
BUN: 14 mg/dL (ref 7–25)
CO2: 29 mmol/L (ref 20–32)
Calcium: 9.8 mg/dL (ref 8.6–10.3)
Chloride: 105 mmol/L (ref 98–110)
Creat: 1.13 mg/dL (ref 0.70–1.30)
Globulin: 2.5 g/dL (ref 1.9–3.7)
Glucose, Bld: 100 mg/dL — ABNORMAL HIGH (ref 65–99)
Potassium: 3.8 mmol/L (ref 3.5–5.3)
Sodium: 140 mmol/L (ref 135–146)
Total Bilirubin: 0.4 mg/dL (ref 0.2–1.2)
Total Protein: 6.9 g/dL (ref 6.1–8.1)
eGFR: 78 mL/min/{1.73_m2} (ref >=60)

## 2023-01-23 LAB — HEMOGLOBIN A1C
Hgb A1c MFr Bld: 6.1 %{Hb} — ABNORMAL HIGH (ref <5.7)
Mean Plasma Glucose: 128 mg/dL
eAG (mmol/L): 7.1 mmol/L

## 2023-01-23 LAB — TSH: TSH: 2.56 m[IU]/L (ref 0.40–4.50)

## 2023-01-23 LAB — LIPID PANEL
Cholesterol: 120 mg/dL (ref <200)
HDL: 36 mg/dL — ABNORMAL LOW (ref >=40)
LDL Cholesterol (Calc): 69 mg/dL
Non-HDL Cholesterol (Calc): 84 mg/dL (ref <130)
Total CHOL/HDL Ratio: 3.3 (calc) (ref <5.0)
Triglycerides: 72 mg/dL (ref <150)

## 2023-01-23 LAB — PSA: PSA: 0.88 ng/mL (ref <=4.00)

## 2023-01-24 ENCOUNTER — Other Ambulatory Visit: Payer: Self-pay | Admitting: Nurse Practitioner

## 2023-01-24 DIAGNOSIS — I10 Essential (primary) hypertension: Secondary | ICD-10-CM

## 2023-01-24 NOTE — Assessment & Plan Note (Signed)
Acute on chronic, ongoing Patient reports issues with initiating and maintaining an erection especially since his Stroke Reports severity is progressively worsening Will try Tadalafil 5 mg PO once per day PRN for assistance with this We reviewed that hyperglycemia, HTN, DM can also contribute to this concern and we will recheck A1c and CMP today Results to dictate further management  Follow up in 6 months or sooner if concerns arise

## 2023-01-24 NOTE — Assessment & Plan Note (Signed)
Most recent A1c was 5.8%  Recheck A1c  Results to dictate further management

## 2023-01-24 NOTE — Telephone Encounter (Signed)
Rx 01/22/23 #90- too soon Requested Prescriptions  Pending Prescriptions Disp Refills   amLODipine (NORVASC) 5 MG tablet [Pharmacy Med Name: AMLODIPINE BESYLATE 5MG  TABLETS] 90 tablet 0    Sig: TAKE 1 TABLET(5 MG) BY MOUTH DAILY     Cardiovascular: Calcium Channel Blockers 2 Passed - 01/24/2023 10:23 AM      Passed - Last BP in normal range    BP Readings from Last 1 Encounters:  01/22/23 132/72         Passed - Last Heart Rate in normal range    Pulse Readings from Last 1 Encounters:  01/22/23 89         Passed - Valid encounter within last 6 months    Recent Outpatient Visits           2 days ago Annual physical exam   [facility] Mecum, Oswaldo Conroy, PA-C   6 months ago Essential hypertension   [facility] [place], Sheliah Mends, PA-C   9 months ago Acute bilateral low back pain with right-sided sciatica   [facility] Danelle Berry, PA-C   9 months ago Acute bilateral low back pain with right-sided sciatica   [facility] Danelle Berry, PA-C   10 months ago Essential hypertension   [facility] Danelle Berry, PA-C       Future Appointments             In 6 months Mecum, Oswaldo Conroy, PA-C [facility], [facility]

## 2023-01-24 NOTE — Assessment & Plan Note (Signed)
Chronic, historic condition BP is close to goal today in office  Currently taking  Amlodipine 5 mg PO every day, Lisinopril 40 mg PO every day and appears to be tolerating well Continue current regimen Refills provided today  Follow up in 6 months or sooner if concerns arise

## 2023-01-25 NOTE — Progress Notes (Signed)
Your labs are back Your A1c was 6.1% which is in the prediabetic range. No medications are indicated at this time but I do recommend reducing your sugar and carb intake and making sure you are exercising regularly Your cholesterol looks great Your CBC does show a very mild anemia.  Please let us know if you have had any symptoms of blood loss or bleeding.  Sometimes this can include dark tarry stools, bleeding from the rectum, vomiting with coffee-ground appearance, pallor, shortness of breath. Your electrolytes, liver and kidney function are overall in normal ranges Your thyroid testing was normal Your prostate testing was normal Please let us know if you have further questions or concerns.  Please continue your current regimen as directed.

## 2023-03-07 ENCOUNTER — Other Ambulatory Visit: Payer: Self-pay | Admitting: Family Medicine

## 2023-03-07 DIAGNOSIS — I10 Essential (primary) hypertension: Secondary | ICD-10-CM

## 2023-04-28 ENCOUNTER — Other Ambulatory Visit: Payer: Self-pay | Admitting: Physician Assistant

## 2023-04-28 DIAGNOSIS — I10 Essential (primary) hypertension: Secondary | ICD-10-CM

## 2023-04-29 ENCOUNTER — Other Ambulatory Visit: Payer: Self-pay | Admitting: Physician Assistant

## 2023-04-29 DIAGNOSIS — I10 Essential (primary) hypertension: Secondary | ICD-10-CM

## 2023-04-29 MED ORDER — AMLODIPINE BESYLATE 5 MG PO TABS: 5.0000 mg | ORAL_TABLET | Freq: Every day | ORAL | 0 refills | Status: DC

## 2023-04-30 NOTE — Telephone Encounter (Signed)
 Duplicate request, refilled 04/29/23.  Requested Prescriptions  Pending Prescriptions Disp Refills   amLODipine (NORVASC) 5 MG tablet [Pharmacy Med Name: AMLODIPINE BESYLATE 5MG  TABLETS] 90 tablet 0    Sig: TAKE 1 TABLET(5 MG) BY MOUTH DAILY     Cardiovascular: Calcium Channel Blockers 2 Passed - 04/30/2023 11:01 AM      Passed - Last BP in normal range    BP Readings from Last 1 Encounters:  01/22/23 132/72         Passed - Last Heart Rate in normal range    Pulse Readings from Last 1 Encounters:  01/22/23 89         Passed - Valid encounter within last 6 months    Recent Outpatient Visits           3 months ago Annual physical exam   [place] [facility] Mecum, Oswaldo Conroy, PA-C   9 months ago Essential hypertension   [facility] Danelle Berry, PA-C   1 year ago Acute bilateral low back pain with right-sided sciatica   [facility] Danelle Berry, PA-C   1 year ago Acute bilateral low back pain with right-sided sciatica   [facility] Danelle Berry, PA-C   1 year ago Essential hypertension   [facility] Danelle Berry, PA-C       Future Appointments             In 2 months Danelle Berry, PA-C [facility], [facility]

## 2023-06-03 ENCOUNTER — Other Ambulatory Visit: Payer: Self-pay | Admitting: Family Medicine

## 2023-06-03 DIAGNOSIS — I10 Essential (primary) hypertension: Secondary | ICD-10-CM

## 2023-06-04 NOTE — Telephone Encounter (Signed)
 Requested Prescriptions  Pending Prescriptions Disp Refills   lisinopril  (ZESTRIL ) 40 MG tablet [Pharmacy Med Name: LISINOPRIL  40MG  TABLETS] 90 tablet 0    Sig: TAKE 1 TABLET(40 MG) BY MOUTH DAILY     Cardiovascular:  ACE Inhibitors Failed - 06/04/2023  4:03 PM      Failed - Valid encounter within last 6 months    Recent Outpatient Visits   None     Future Appointments             In 1 month Adeline Hone, PA-C [facility]            Passed - Cr in normal range and within 180 days    Creat  Date Value Ref Range Status  01/22/2023 1.13 0.70 - 1.30 mg/dL Final   Creatinine, Urine  Date Value Ref Range Status  03/22/2022 185 20 - 320 mg/dL Final         Passed - K in normal range and within 180 days    Potassium  Date Value Ref Range Status  01/22/2023 3.8 3.5 - 5.3 mmol/L Final         Passed - Patient is not pregnant      Passed - Last BP in normal range    BP Readings from Last 1 Encounters:  01/22/23 132/72

## 2023-06-21 ENCOUNTER — Other Ambulatory Visit: Payer: Self-pay

## 2023-06-21 ENCOUNTER — Telehealth: Payer: Self-pay

## 2023-06-21 DIAGNOSIS — I679 Cerebrovascular disease, unspecified: Secondary | ICD-10-CM

## 2023-06-21 DIAGNOSIS — I69354 Hemiplegia and hemiparesis following cerebral infarction affecting left non-dominant side: Secondary | ICD-10-CM

## 2023-06-21 DIAGNOSIS — Z5181 Encounter for therapeutic drug level monitoring: Secondary | ICD-10-CM

## 2023-06-21 DIAGNOSIS — E782 Mixed hyperlipidemia: Secondary | ICD-10-CM

## 2023-06-21 MED ORDER — EZETIMIBE 10 MG PO TABS: 10.0000 mg | ORAL_TABLET | Freq: Every day | ORAL | 0 refills | Status: DC

## 2023-06-21 NOTE — Telephone Encounter (Signed)
 30-day sent until appt w/Leisa next month

## 2023-06-21 NOTE — Telephone Encounter (Signed)
 Refill request on   ezetimibe  (ZETIA ) 10 MG tablet

## 2023-07-23 ENCOUNTER — Encounter: Payer: Self-pay | Admitting: Family Medicine

## 2023-07-23 ENCOUNTER — Ambulatory Visit: Payer: Self-pay | Admitting: Family Medicine

## 2023-07-23 VITALS — BP 130/82 | HR 84 | Resp 16 | Ht 74.0 in | Wt 229.0 lb

## 2023-07-23 DIAGNOSIS — J0191 Acute recurrent sinusitis, unspecified: Secondary | ICD-10-CM

## 2023-07-23 DIAGNOSIS — I69354 Hemiplegia and hemiparesis following cerebral infarction affecting left non-dominant side: Secondary | ICD-10-CM

## 2023-07-23 DIAGNOSIS — D649 Anemia, unspecified: Secondary | ICD-10-CM

## 2023-07-23 DIAGNOSIS — I679 Cerebrovascular disease, unspecified: Secondary | ICD-10-CM | POA: Diagnosis not present

## 2023-07-23 DIAGNOSIS — R0683 Snoring: Secondary | ICD-10-CM

## 2023-07-23 DIAGNOSIS — R5383 Other fatigue: Secondary | ICD-10-CM

## 2023-07-23 DIAGNOSIS — R400 Somnolence: Secondary | ICD-10-CM

## 2023-07-23 DIAGNOSIS — E782 Mixed hyperlipidemia: Secondary | ICD-10-CM | POA: Diagnosis not present

## 2023-07-23 DIAGNOSIS — I10 Essential (primary) hypertension: Secondary | ICD-10-CM

## 2023-07-23 DIAGNOSIS — R7303 Prediabetes: Secondary | ICD-10-CM

## 2023-07-23 MED ORDER — AMOXICILLIN-POT CLAVULANATE 875-125 MG PO TABS: 1.0000 | ORAL_TABLET | Freq: Two times a day (BID) | ORAL | 0 refills | Status: AC

## 2023-07-23 MED ORDER — LISINOPRIL 40 MG PO TABS: 40.0000 mg | ORAL_TABLET | Freq: Every day | ORAL | 1 refills | Status: DC

## 2023-07-23 MED ORDER — ATORVASTATIN CALCIUM 40 MG PO TABS: 40.0000 mg | ORAL_TABLET | Freq: Every day | ORAL | 1 refills | Status: DC

## 2023-07-23 MED ORDER — EZETIMIBE 10 MG PO TABS: 10.0000 mg | ORAL_TABLET | Freq: Every day | ORAL | 1 refills | Status: DC

## 2023-07-23 MED ORDER — AMLODIPINE BESYLATE 5 MG PO TABS: 5.0000 mg | ORAL_TABLET | Freq: Every day | ORAL | 1 refills | Status: DC

## 2023-07-23 NOTE — Progress Notes (Signed)
 Name: Charles Alvarado   MRN: [id number]    DOB: 1970/10/31   Date:07/23/2023       Progress Note  Chief Complaint  Patient presents with   Medical Management of Chronic Issues    6 month follow-up   Hypertension   Hyperlipidemia     Subjective:   Charles Alvarado is a 53 y.o. male, presents to clinic for routine follow up on chronic conditions  Recurrent sinusitis his symptoms are really bad right now, needs abx about 1-2 x a year He is not interested in going back to ENT  Hypertension:  Currently managed on amlodipine  5, lisinopril  40 Pt reports good med compliance and denies any SE.   Blood pressure today is well controlled. BP Readings from Last 3 Encounters:  07/23/23 130/82  01/22/23 132/72  07/23/22 122/74   Pt denies CP, SOB, exertional sx, LE edema, palpitation, Ha's, visual disturbances, lightheadedness, hypotension, syncope. Dietary efforts for BP?  Exercising and eating healthy  HLD labs done with last OV, no change to statin, good compliance no new concerns or side effects Lab Results  Component Value Date   CHOL 120 01/22/2023   HDL 36 (L) 01/22/2023   LDLCALC 69 01/22/2023   TRIG 72 01/22/2023   CHOLHDL 3.3 01/22/2023   New anemia with review of last labs very mild He does note intermittent blood on toilet paper but none on stool and no bloody stools no abdominal pain no changes in bowels or unintentional weight loss  Generalized fatigue feels tired all thetime but he can go work out in [employer] and muscle are good, doesn't get tired, maintaining weight, no exertional sx Sleeps about 8-9 hours doesn't wake up feeling rested      Current Outpatient Medications:    amLODipine  (NORVASC ) 5 MG tablet, Take 1 tablet (5 mg total) by mouth daily., Disp: 90 tablet, Rfl: 0   aspirin 81 MG chewable tablet, Chew by mouth., Disp: , Rfl:    atorvastatin  (LIPITOR) 40 MG tablet, TAKE 1 TABLET(40 MG) BY MOUTH DAILY, Disp: 90 tablet, Rfl: 2   ezetimibe   (ZETIA ) 10 MG tablet, Take 1 tablet (10 mg total) by mouth daily., Disp: 30 tablet, Rfl: 0   lisinopril  (ZESTRIL ) 40 MG tablet, TAKE 1 TABLET(40 MG) BY MOUTH DAILY, Disp: 90 tablet, Rfl: 0   montelukast  (SINGULAIR ) 10 MG tablet, Take 1 tablet (10 mg total) by mouth at bedtime., Disp: 90 tablet, Rfl: 1   tadalafil  (CIALIS ) 5 MG tablet, Take 1 tablet (5 mg total) by mouth daily as needed for erectile dysfunction., Disp: 10 tablet, Rfl: 2  Patient Active Problem List   Diagnosis Date Noted   Advanced care planning/counseling discussion 01/22/2023   Erectile dysfunction 01/22/2023   Screening for colon cancer 03/12/2022   Polyp of colon 03/12/2022   Narcotic dependence (HCC) 02/12/2022   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   Recurrent sinusitis 02/12/2022   Seasonal allergies 02/12/2022   Hemiplegia of left 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 (HCC) 12/10/2018   History of ischemic stroke 12/10/2018   Prediabetes 12/10/2018   Intracranial vascular stenosis 09/13/2018   Heart murmur 02/21/2015   Essential hypertension 02/21/2015   Mixed hyperlipidemia 02/21/2015    Past Surgical History:  Procedure Laterality Date   CLOSED REDUCTION SHOULDER DISLOCATION  06-2015   COLONOSCOPY WITH PROPOFOL  N/A 03/12/2022   Procedure: COLONOSCOPY WITH PROPOFOL ;  Surgeon: Marnee Sink, MD;  Location: [facility];  Service: Endoscopy;  Laterality: N/A;   POLYPECTOMY  03/12/2022   Procedure: POLYPECTOMY;  Surgeon: Marnee Sink, MD;  Location[facility];  Service: Endoscopy;;   SHOULDER ARTHROSCOPY WITH OPEN ROTATOR CUFF REPAIR AND DISTAL CLAVICLE ACROMINECTOMY Right 06/16/2015   Procedure: right shoulder arthroscopic repair of labral tear;  Surgeon: Rande Bushy, MD;  Location: [facility];  Service: Orthopedics;  Laterality: Right;    Family History  Problem Relation Age of Onset   Hypertension Mother    Hypertension Father    Diabetes Father    Diabetes Sister    Hypertension  Sister     Social History   Tobacco Use   Smoking status: Never   Smokeless tobacco: Never  Vaping Use   Vaping status: Never Used  Substance Use Topics   Alcohol use: No    Alcohol/week: 0.0 standard drinks of alcohol   Drug use: No     No Known Allergies  Health Maintenance  Topic Date Due   COVID-19 Vaccine (4 - 2024-25 season) 08/07/2023 (Originally 10/07/2022)   Zoster Vaccines- Shingrix  (1 of 2) 10/22/2023 (Originally 08/26/2020)   DTaP/Tdap/Td (2 - Td or Tdap) 04/24/2025   Colonoscopy  03/12/2029   Hepatitis C Screening  Completed   HIV Screening  Completed   HPV VACCINES  Aged Out   Meningococcal B Vaccine  Aged Out   INFLUENZA VACCINE  Discontinued    Chart Review Today: I personally reviewed active problem list, medication list, allergies, family history, social history, health maintenance, notes from last encounter, lab results, imaging with the patient/caregiver today.   Review of Systems  Constitutional: Negative.   HENT:  Positive for congestion, postnasal drip, rhinorrhea, sinus pressure and sinus pain.   Eyes: Negative.   Respiratory: Negative.    Cardiovascular: Negative.   Gastrointestinal: Negative.   Endocrine: Negative.   Genitourinary: Negative.   Musculoskeletal: Negative.   Skin: Negative.   Allergic/Immunologic: Negative.   Neurological: Negative.   Hematological: Negative.   Psychiatric/Behavioral: Negative.    All other systems reviewed and are negative.    Objective:   Vitals:   07/23/23 0837  BP: 130/82  Pulse: 84  Resp: 16  SpO2: 96%  Weight: 229 lb (103.9 kg)  Height: 6' 2 (1.88 m)    Body mass index is 29.4 kg/m.  Physical Exam Vitals and nursing note reviewed.  Constitutional:      General: He is not in acute distress.    Appearance: Normal appearance. He is well-developed. He is not ill-appearing, toxic-appearing or diaphoretic.  HENT:     Head: Normocephalic and atraumatic.     Right Ear: Tympanic membrane,  ear canal and external ear normal. There is no impacted cerumen.     Left Ear: Tympanic membrane, ear canal and external ear normal. There is no impacted cerumen.     Nose: Mucosal edema, congestion and rhinorrhea present. Rhinorrhea is clear.     Right Sinus: Maxillary sinus tenderness and frontal sinus tenderness present.     Left Sinus: Maxillary sinus tenderness and frontal sinus tenderness present.     Mouth/Throat:     Pharynx: No oropharyngeal exudate.   Eyes:     General: No scleral icterus.       Right eye: No discharge.        Left eye: No discharge.     Conjunctiva/sclera: Conjunctivae normal.   Neck:     Trachea: No tracheal deviation.   Cardiovascular:     Rate and Rhythm: Normal rate and regular rhythm.     Pulses:  Normal pulses.     Heart sounds: Normal heart sounds. No murmur heard.    No friction rub. No gallop.  Pulmonary:     Effort: Pulmonary effort is normal. No respiratory distress.     Breath sounds: Normal breath sounds. No stridor. No wheezing, rhonchi or rales.   Musculoskeletal:     Right lower leg: No edema.     Left lower leg: No edema.   Skin:    General: Skin is warm and dry.     Findings: No rash.   Neurological:     Mental Status: He is alert. Mental status is at baseline.     Motor: No abnormal muscle tone.     Coordination: Coordination normal.     Gait: Gait normal.   Psychiatric:        Mood and Affect: Mood normal.        Behavior: Behavior normal.        Results for orders placed or performed in visit on 01/22/23  TSH   Collection Time: 01/22/23  9:12 AM  Result Value Ref Range   TSH 2.56 0.40 - 4.50 mIU/L  Hemoglobin A1c   Collection Time: 01/22/23  9:12 AM  Result Value Ref Range   Hgb A1c MFr Bld 6.1 (H) <5.7 % of total Hgb   Mean Plasma Glucose 128 mg/dL   eAG (mmol/L) 7.1 mmol/L  Lipid panel   Collection Time: 01/22/23  9:12 AM  Result Value Ref Range   Cholesterol 120 <200 mg/dL   HDL 36 (L) > OR = 40 mg/dL    Triglycerides 72 <161 mg/dL   LDL Cholesterol (Calc) 69 mg/dL (calc)   Total CHOL/HDL Ratio 3.3 <5.0 (calc)   Non-HDL Cholesterol (Calc) 84 <096 mg/dL (calc)  CBC with Differential/Platelet   Collection Time: 01/22/23  9:12 AM  Result Value Ref Range   WBC 4.2 3.8 - 10.8 Thousand/uL   RBC 4.62 4.20 - 5.80 Million/uL   Hemoglobin 12.7 (L) 13.2 - 17.1 g/dL   HCT 04.5 (L) 40.9 - 81.1 %   MCV 81.6 80.0 - 100.0 fL   MCH 27.5 27.0 - 33.0 pg   MCHC 33.7 32.0 - 36.0 g/dL   RDW 91.4 78.2 - 95.6 %   Platelets 225 140 - 400 Thousand/uL   MPV 9.5 7.5 - 12.5 fL   Neutro Abs 2,071 1,500 - 7,800 cells/uL   Absolute Lymphocytes 1,525 850 - 3,900 cells/uL   Absolute Monocytes 344 200 - 950 cells/uL   Eosinophils Absolute 202 15 - 500 cells/uL   Basophils Absolute 59 0 - 200 cells/uL   Neutrophils Relative % 49.3 %   Total Lymphocyte 36.3 %   Monocytes Relative 8.2 %   Eosinophils Relative 4.8 %   Basophils Relative 1.4 %  COMPLETE METABOLIC PANEL WITH GFR   Collection Time: 01/22/23  9:12 AM  Result Value Ref Range   Glucose, Bld 100 (H) 65 - 99 mg/dL   BUN 14 7 - 25 mg/dL   Creat 2.13 0.86 - 5.78 mg/dL   eGFR 78 > OR = 60 IO/NGE/9.52W4   BUN/Creatinine Ratio SEE NOTE: 6 - 22 (calc)   Sodium 140 135 - 146 mmol/L   Potassium 3.8 3.5 - 5.3 mmol/L   Chloride 105 98 - 110 mmol/L   CO2 29 20 - 32 mmol/L   Calcium  9.8 8.6 - 10.3 mg/dL   Total Protein 6.9 6.1 - 8.1 g/dL   Albumin 4.4 3.6 - 5.1 g/dL  Globulin 2.5 1.9 - 3.7 g/dL (calc)   AG Ratio 1.8 1.0 - 2.5 (calc)   Total Bilirubin 0.4 0.2 - 1.2 mg/dL   Alkaline phosphatase (APISO) 88 35 - 144 U/L   AST 24 10 - 35 U/L   ALT 33 9 - 46 U/L  PSA   Collection Time: 01/22/23  9:12 AM  Result Value Ref Range   PSA 0.88 < OR = 4.00 ng/mL      Assessment & Plan:   Essential hypertension Assessment & Plan: BP at goal today with his current meds Good compliance, no SE or concerns Continue same meds and increased aerobic activity  encouraged for heart health BP Readings from Last 3 Encounters:  07/23/23 130/82  01/22/23 132/72  07/23/22 122/74     Orders: -     amLODIPine  Besylate; Take 1 tablet (5 mg total) by mouth daily.  Dispense: 90 tablet; Refill: 1 -     Lisinopril ; Take 1 tablet (40 mg total) by mouth daily.  Dispense: 90 tablet; Refill: 1  Mixed hyperlipidemia Assessment & Plan: On statin and zetia , hx of CVA Labs done Dec, no changes so do lipids annually   Orders: -     Atorvastatin  Calcium ; Take 1 tablet (40 mg total) by mouth daily.  Dispense: 90 tablet; Refill: 1 -     Ezetimibe ; Take 1 tablet (10 mg total) by mouth daily.  Dispense: 90 tablet; Refill: 1  Intracranial vascular stenosis Assessment & Plan: Per neurology and vascular specialist  Orders: -     Ezetimibe ; Take 1 tablet (10 mg total) by mouth daily.  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  Dispense: 90 tablet; Refill: 1  Prediabetes Assessment & Plan: Recheck labs  Orders: -     Comprehensive metabolic panel with GFR -     Hemoglobin A1c  Anemia, unspecified type Assessment & Plan: Slight decrease in hemoglobin with his most recent labs which is new We will recheck CBC today Hemoglobin  Date Value Ref Range Status       01/22/2023 12.7 (L) 13.2 - 17.1 g/dL Final  95/62/1308 65.7 13.2 - 17.1 g/dL Final  84/69/6295 28.4 13.2 - 17.1 g/dL Final  If there is continued anemia or any worsening we may evaluate further with stool testing/adding iron panel etc.   Orders: -     CBC with Differential/Platelet -     Iron, TIBC and  Ferritin Panel -     Vitamin B12 -     VITAMIN D 25 Hydroxy (Vit-D Deficiency, Fractures) -     TSH -     T4, free  Acute recurrent sinusitis, unspecified location Assessment & Plan: Chronic sinus nasal issues and allergies with acute worsening for the past several weeks with increased pain and tenderness to his cheeks and around his eyes he is managing with over-the-counter allergy medicines and Singulair  he was previously on a nose spray but is not doing that right now he is not interested in following up again with ENT will treat with antibiotics due to increased severe symptoms lasting more than 2  weeks He states that usually after he gets antibiotics his symptoms improve for up to 5 or 6 months Have recommended he do nasal saline sprays, stay on intranasal steroid sprays with severely swollen nasal turbinates, stay on allergy medications which he can swap out intermittently for example use Allegra for part of the year then switch to Xyzal , Zyrtec  or Claritin encouraged to continue Singulair   Orders: -     Amoxicillin -Pot Clavulanate; Take 1 tablet by mouth 2 (two) times daily for 10 days.  Dispense: 20 tablet; Refill: 0  Fatigue, unspecified type Assessment & Plan: He reports a whole lifetime of fatigue and lack of energy He mentions this at the very end of his appointment I explained that I am not able to fully evaluate this complaint in addition to other acute issues and it is routine follow-up but we did add some labs quickly to recheck anemia, rule out hypothyroid, will check testosterone, ESS done and recommended sleep study or consult with sleep medicine  Orders: -     Iron, TIBC and Ferritin Panel -     Vitamin B12 -     VITAMIN D 25 Hydroxy (Vit-D Deficiency, Fractures) -     TSH -     T4, free -     Testosterone -     Pulmonary Visit  Snoring Recommend ENT follow-up and medications as noted above - see sinusitis  -     Pulmonary Visit  Daytime sleepiness Epworth  Sleepiness Scale:  Sitting and reading: Moderate chance of dozing Watching TV: High chance of dozing Sitting, inactive in a public place (e.g. a theatre or a meeting): Would never doze As a passenger in a car for an hour without a break: Would never doze Lying down to rest in the afternoon when circumstances permit: Slight chance of dozing Sitting and talking to someone: Would never doze Sitting quietly after a lunch without alcohol: Slight chance of dozing In a car, while stopped for a few minutes in traffic: Would never doze  Total score: 7  -     Pulmonary Visit       Return for 2-4 weeks to f/up on fatigue and lab results.   Adeline Hone, PA-C 07/23/23 9:08 AM

## 2023-07-23 NOTE — Patient Instructions (Signed)
 Please continue treatment of your sinuses and allergies with an over the counter antihistamine, a steroid nose spray and saline spray will also help. Take the antibiotics and continue all the meds listed above  You should get a call from the sleep medicine pulmonary doctor to do a consult and then potentially set up a home sleep study  Lets follow up in our office in about a month to review your symptoms and all results.

## 2023-07-23 NOTE — Assessment & Plan Note (Signed)
 BP at goal today with his current meds Good compliance, no SE or concerns Continue same meds and increased aerobic activity encouraged for heart health BP Readings from Last 3 Encounters:  07/23/23 130/82  01/22/23 132/72  07/23/22 122/74

## 2023-07-23 NOTE — Assessment & Plan Note (Signed)
 On statin and zetia , hx of CVA Labs done Dec, no changes so do lipids annually

## 2023-07-23 NOTE — Assessment & Plan Note (Signed)
 Recheck labs

## 2023-07-24 DIAGNOSIS — R5383 Other fatigue: Secondary | ICD-10-CM | POA: Insufficient documentation

## 2023-07-24 DIAGNOSIS — D649 Anemia, unspecified: Secondary | ICD-10-CM | POA: Insufficient documentation

## 2023-07-24 LAB — COMPREHENSIVE METABOLIC PANEL WITH GFR
AG Ratio: 2 (calc) (ref 1.0–2.5)
ALT: 30 U/L (ref 9–46)
AST: 25 U/L (ref 10–35)
Albumin: 4.6 g/dL (ref 3.6–5.1)
Alkaline phosphatase (APISO): 97 U/L (ref 35–144)
BUN: 15 mg/dL (ref 7–25)
CO2: 27 mmol/L (ref 20–32)
Calcium: 9.6 mg/dL (ref 8.6–10.3)
Chloride: 105 mmol/L (ref 98–110)
Creat: 1.04 mg/dL (ref 0.70–1.30)
Globulin: 2.3 g/dL (ref 1.9–3.7)
Glucose, Bld: 104 mg/dL — ABNORMAL HIGH (ref 65–99)
Potassium: 4.2 mmol/L (ref 3.5–5.3)
Sodium: 138 mmol/L (ref 135–146)
Total Bilirubin: 0.4 mg/dL (ref 0.2–1.2)
Total Protein: 6.9 g/dL (ref 6.1–8.1)
eGFR: 86 mL/min/{1.73_m2} (ref >=60)

## 2023-07-24 LAB — CBC WITH DIFFERENTIAL/PLATELET
Absolute Lymphocytes: 1498 {cells}/uL (ref 850–3900)
Absolute Monocytes: 408 {cells}/uL (ref 200–950)
Basophils Absolute: 58 {cells}/uL (ref 0–200)
Basophils Relative: 1.2 %
Eosinophils Absolute: 139 {cells}/uL (ref 15–500)
Eosinophils Relative: 2.9 %
HCT: 39 % (ref 38.5–50.0)
Hemoglobin: 12.7 g/dL — ABNORMAL LOW (ref 13.2–17.1)
MCH: 27.5 pg (ref 27.0–33.0)
MCHC: 32.6 g/dL (ref 32.0–36.0)
MCV: 84.4 fL (ref 80.0–100.0)
MPV: 9.8 fL (ref 7.5–12.5)
Monocytes Relative: 8.5 %
Neutro Abs: 2698 {cells}/uL (ref 1500–7800)
Neutrophils Relative %: 56.2 %
Platelets: 227 10*3/uL (ref 140–400)
RBC: 4.62 10*6/uL (ref 4.20–5.80)
RDW: 13 % (ref 11.0–15.0)
Total Lymphocyte: 31.2 %
WBC: 4.8 10*3/uL (ref 3.8–10.8)

## 2023-07-24 LAB — IRON,TIBC AND FERRITIN PANEL
%SAT: 21 % (ref 20–48)
Ferritin: 93 ng/mL (ref 38–380)
Iron: 65 ug/dL (ref 50–180)
TIBC: 305 ug/dL (ref 250–425)

## 2023-07-24 LAB — TESTOSTERONE: Testosterone: 372 ng/dL (ref 250–827)

## 2023-07-24 LAB — VITAMIN B12: Vitamin B-12: 477 pg/mL (ref 200–1100)

## 2023-07-24 LAB — HEMOGLOBIN A1C
Hgb A1c MFr Bld: 6.2 % — ABNORMAL HIGH (ref <5.7)
Mean Plasma Glucose: 131 mg/dL
eAG (mmol/L): 7.3 mmol/L

## 2023-07-24 LAB — VITAMIN D 25 HYDROXY (VIT D DEFICIENCY, FRACTURES): Vit D, 25-Hydroxy: 30 ng/mL (ref 30–100)

## 2023-07-24 LAB — T4, FREE: Free T4: 1.1 ng/dL (ref 0.8–1.8)

## 2023-07-24 LAB — TSH: TSH: 1.88 m[IU]/L (ref 0.40–4.50)

## 2023-07-24 NOTE — Assessment & Plan Note (Signed)
 He reports a whole lifetime of fatigue and lack of energy He mentions this at the very end of his appointment I explained that I am not able to fully evaluate this complaint in addition to other acute issues and it is routine follow-up but we did add some labs quickly to recheck anemia, rule out hypothyroid, will check testosterone, ESS done and recommended sleep study or consult with sleep medicine

## 2023-07-24 NOTE — Assessment & Plan Note (Signed)
 Chronic sinus nasal issues and allergies with acute worsening for the past several weeks with increased pain and tenderness to his cheeks and around his eyes he is managing with over-the-counter allergy medicines and Singulair  he was previously on a nose spray but is not doing that right now he is not interested in following up again with ENT will treat with antibiotics due to increased severe symptoms lasting more than 2 weeks He states that usually after he gets antibiotics his symptoms improve for up to 5 or 6 months Have recommended he do nasal saline sprays, stay on intranasal steroid sprays with severely swollen nasal turbinates, stay on allergy medications which he can swap out intermittently for example use Allegra for part of the year then switch to Xyzal , Zyrtec  or Claritin encouraged to continue Singulair 

## 2023-07-24 NOTE — Assessment & Plan Note (Addendum)
 Slight decrease in hemoglobin with his most recent labs which is new We will recheck CBC today Hemoglobin  Date Value Ref Range Status       01/22/2023 12.7 (L) 13.2 - 17.1 g/dL Final  84/69/6295 28.4 13.2 - 17.1 g/dL Final  13/24/4010 27.2 13.2 - 17.1 g/dL Final  If there is continued anemia or any worsening we may evaluate further with stool testing/adding iron panel etc.

## 2023-07-24 NOTE — Assessment & Plan Note (Signed)
 Per neurology and vascular specialist

## 2023-07-24 NOTE — Assessment & Plan Note (Signed)
 Patient has not followed up with neurology and continues to manage with aspirin statin and Zetia  and keeping his blood pressure controlled no new symptoms or events

## 2023-07-25 ENCOUNTER — Other Ambulatory Visit: Payer: Self-pay | Admitting: Family Medicine

## 2023-07-25 DIAGNOSIS — I10 Essential (primary) hypertension: Secondary | ICD-10-CM

## 2023-07-26 ENCOUNTER — Other Ambulatory Visit: Payer: Self-pay | Admitting: Family Medicine

## 2023-07-26 DIAGNOSIS — I69354 Hemiplegia and hemiparesis following cerebral infarction affecting left non-dominant side: Secondary | ICD-10-CM

## 2023-07-26 DIAGNOSIS — E782 Mixed hyperlipidemia: Secondary | ICD-10-CM

## 2023-07-26 DIAGNOSIS — I679 Cerebrovascular disease, unspecified: Secondary | ICD-10-CM

## 2023-07-26 NOTE — Telephone Encounter (Signed)
 Requested Prescriptions  Pending Prescriptions Disp Refills   amLODipine  (NORVASC ) 5 MG tablet [Pharmacy Med Name: AMLODIPINE  BESYLATE 5MG  TABLETS] 90 tablet 1    Sig: TAKE 1 TABLET(5 MG) BY MOUTH DAILY     Cardiovascular: Calcium  Channel Blockers 2 Passed - 07/26/2023  3:50 PM      Passed - Last BP in normal range    BP Readings from Last 1 Encounters:  07/23/23 130/82         Passed - Last Heart Rate in normal range    Pulse Readings from Last 1 Encounters:  07/23/23 84         Passed - Valid encounter within last 6 months    Recent Outpatient Visits           3 days ago Essential hypertension   [facility] Aurelia Osborn [facility] Adeline Hone, PA-C

## 2023-07-29 ENCOUNTER — Ambulatory Visit: Payer: Self-pay | Admitting: Family Medicine

## 2023-07-29 DIAGNOSIS — R5383 Other fatigue: Secondary | ICD-10-CM

## 2023-07-29 NOTE — Telephone Encounter (Signed)
 Too soon for refill.  Requested Prescriptions  Pending Prescriptions Disp Refills   ezetimibe  (ZETIA ) 10 MG tablet [Pharmacy Med Name: EZETIMIBE  10MG  TABLETS] 30 tablet     Sig: TAKE 1 TABLET(10 MG) BY MOUTH DAILY     Cardiovascular:  Antilipid - Sterol Transport Inhibitors Failed - 07/29/2023  4:20 PM      Failed - Lipid Panel in normal range within the last 12 months    Cholesterol  Date Value Ref Range Status  01/22/2023 120 <200 mg/dL Final   LDL Cholesterol (Calc)  Date Value Ref Range Status  01/22/2023 69 mg/dL (calc) Final    Comment:    Reference range: <100 . Desirable range <100 mg/dL for primary prevention;   <70 mg/dL for patients with CHD or diabetic patients  with > or = 2 CHD risk factors. SABRA LDL-C is now calculated using the Martin-Hopkins  calculation, which is a validated novel method providing  better accuracy than the Friedewald equation in the  estimation of LDL-C.  Gladis APPLETHWAITE et al. SANDREA. 7986;689(80): 2061-2068  (http://education.Quest[employer].com/faq/FAQ164)    HDL  Date Value Ref Range Status  01/22/2023 36 (L) > OR = 40 mg/dL Final   Triglycerides  Date Value Ref Range Status  01/22/2023 72 <150 mg/dL Final         Passed - AST in normal range and within 360 days    AST  Date Value Ref Range Status  07/23/2023 25 10 - 35 U/L Final         Passed - ALT in normal range and within 360 days    ALT  Date Value Ref Range Status  07/23/2023 30 9 - 46 U/L Final         Passed - Patient is not pregnant      Passed - Valid encounter within last 12 months    Recent Outpatient Visits           6 days ago Essential hypertension   [facility] Leavy Mole, PA-C

## 2023-07-29 NOTE — Telephone Encounter (Incomplete)
 Duplicate request

## 2023-08-12 ENCOUNTER — Ambulatory Visit: Admitting: Family Medicine

## 2023-08-13 ENCOUNTER — Telehealth: Admitting: Physician Assistant

## 2023-08-13 DIAGNOSIS — L255 Unspecified contact dermatitis due to plants, except food: Secondary | ICD-10-CM | POA: Diagnosis not present

## 2023-08-13 MED ORDER — PREDNISONE 10 MG (21) PO TBPK: ORAL_TABLET | ORAL | 0 refills | Status: DC

## 2023-08-13 NOTE — Progress Notes (Signed)
 Virtual Visit Consent   Charles Alvarado, you are scheduled for a virtual visit with a [facility] provider today. Just as with appointments in the office, your consent must be obtained to participate. Your consent will be active for this visit and any virtual visit you may have with one of our providers in the next 365 days. If you have a MyChart account, a copy of this consent can be sent to you electronically.  As this is a virtual visit, video technology does not allow for your provider to perform a traditional examination. This may limit your provider's ability to fully assess your condition. If your provider identifies any concerns that need to be evaluated in person or the need to arrange testing (such as labs, EKG, etc.), we will make arrangements to do so. Although advances in technology are sophisticated, we cannot ensure that it will always work on either your end or our end. If the connection with a video visit is poor, the visit may have to be switched to a telephone visit. With either a video or telephone visit, we are not always able to ensure that we have a secure connection.  By engaging in this virtual visit, you consent to the provision of healthcare and authorize for your insurance to be billed (if applicable) for the services provided during this visit. Depending on your insurance coverage, you may receive a charge related to this service.  I need to obtain your verbal consent now. Are you willing to proceed with your visit today? Charles Alvarado has provided verbal consent on 08/13/2023 for a virtual visit (video or telephone). Teena Shuck, [place]  Date: 08/13/2023 5:19 PM   Virtual Visit via Video Note   I, Teena Shuck, connected with  Charles Alvarado  ([id number], 1970-03-19) on 08/13/23 at  5:15 PM EDT by a video-enabled telemedicine application and verified that I am speaking with the correct person using two identifiers.  Location: Patient: Virtual Visit  Location Patient: Home Provider: Virtual Visit Location Provider: Home Office   I discussed the limitations of evaluation and management by telemedicine and the availability of in person appointments. The patient expressed understanding and agreed to proceed.    History of Present Illness: Charles Alvarado is a 53 y.o. who identifies as a male who was assigned male at birth, and is being seen today for poison ivy.SABRA  HPI: Rash This is a new problem. The current episode started yesterday. The problem has been gradually worsening since onset. The rash is diffuse. The rash is characterized by dryness, redness and itchiness. He was exposed to plant contact. Pertinent negatives include no anorexia, congestion, cough, diarrhea, eye pain, facial edema, fatigue, fever, joint pain, nail changes, rhinorrhea, shortness of breath, sore throat or vomiting. Past treatments include nothing. The treatment provided no relief.    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   Acute recurrent sinusitis 02/12/2022   Seasonal allergies 02/12/2022   Hemiplegia of left 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 (HCC) 12/10/2018   History of ischemic stroke 12/10/2018   Prediabetes 12/10/2018   Intracranial vascular stenosis 09/13/2018   Heart murmur 02/21/2015   Essential hypertension 02/21/2015   Mixed hyperlipidemia 02/21/2015  Allergies: No Known Allergies Medications:  Current Outpatient Medications:    amLODipine  (NORVASC ) 5 MG tablet, Take 1 tablet (5 mg total) by mouth daily., Disp: 90 tablet, Rfl: 1   aspirin 81 MG chewable tablet, Chew by mouth., Disp: , Rfl:    atorvastatin  (LIPITOR) 40 MG tablet,  Take 1 tablet (40 mg total) by mouth daily., Disp: 90 tablet, Rfl: 1   ezetimibe  (ZETIA ) 10 MG tablet, Take 1 tablet (10 mg total) by mouth daily., Disp: 90 tablet, Rfl: 1   lisinopril  (ZESTRIL ) 40 MG tablet, Take 1 tablet (40 mg total) by mouth daily., Disp: 90 tablet, Rfl: 1   montelukast  (SINGULAIR ) 10 MG tablet, Take 1 tablet (10 mg total) by mouth at bedtime., Disp: 90 tablet, Rfl: 1   tadalafil  (CIALIS ) 5 MG tablet, Take 1 tablet (5 mg total) by mouth daily as needed for erectile dysfunction., Disp: 10 tablet, Rfl: 2  Observations/Objective: Patient is well-developed, well-nourished in no acute distress.  Resting comfortably  at home.  Head is normocephalic, atraumatic.  No labored breathing.  Speech is clear and coherent with logical content.  Patient is alert and oriented at baseline.  Red raised pruritic appearing lesion on left arm wrist - shoulder.   Assessment and Plan: 1. Contact dermatitis due to plants, except food, unspecified contact dermatitis type (Primary)  Patient presenting with contact dermatitis due to poison ivy.   Patient afebrile.  No evidence of infections including cellulitis, herpes zoster, atopic derm, measles, rubella. Patient prescribed steroid pack.  Advised to monitor exposures and record.  Supportive therapies discussed.   Follow up with primary provider in 2-3 days if symptoms continue. Report to ED if high fever, spread of rash, pain, or other concerns.  Follow Up Instructions: I discussed the assessment and treatment plan with the patient. The patient was provided an opportunity to ask questions and all were answered. The patient agreed with the plan and demonstrated an understanding of the instructions.  A copy of instructions were sent to the patient via MyChart unless otherwise noted below.     The patient was advised to call back or seek an in-person evaluation if the symptoms worsen or if the condition fails to improve as anticipated.    Teena Shuck, PA-C

## 2023-08-13 NOTE — Patient Instructions (Signed)
  Charles Alvarado, thank you for joining Charles Shuck, PA-C for today's virtual visit.  While this provider is not your primary care provider (PCP), if your PCP is located in our provider database this encounter information will be shared with them immediately following your visit.   A [facility] MyChart account gives you access to today's visit and all your visits, tests, and labs performed at [facility]  click here if you don't have a [facility] MyChart account or go to [email or web address]  Consent: (Patient) Charles Alvarado provided verbal consent for this virtual visit at the beginning of the encounter.  Current Medications:  Current Outpatient Medications:    amLODipine  (NORVASC ) 5 MG tablet, Take 1 tablet (5 mg total) by mouth daily., Disp: 90 tablet, Rfl: 1   aspirin 81 MG chewable tablet, Chew by mouth., Disp: , Rfl:    atorvastatin  (LIPITOR) 40 MG tablet, Take 1 tablet (40 mg total) by mouth daily., Disp: 90 tablet, Rfl: 1   ezetimibe  (ZETIA ) 10 MG tablet, Take 1 tablet (10 mg total) by mouth daily., Disp: 90 tablet, Rfl: 1   lisinopril  (ZESTRIL ) 40 MG tablet, Take 1 tablet (40 mg total) by mouth daily., Disp: 90 tablet, Rfl: 1   montelukast  (SINGULAIR ) 10 MG tablet, Take 1 tablet (10 mg total) by mouth at bedtime., Disp: 90 tablet, Rfl: 1   tadalafil  (CIALIS ) 5 MG tablet, Take 1 tablet (5 mg total) by mouth daily as needed for erectile dysfunction., Disp: 10 tablet, Rfl: 2   Medications ordered in this encounter:  No orders of the defined types were placed in this encounter.    *If you need refills on other medications prior to your next appointment, please contact your pharmacy*  Follow-Up: Call back or seek an in-person evaluation if the symptoms worsen or if the condition fails to improve as anticipated.  [facility] [phone]  Other Instructions Please report to the nearest Emergency room with any worsening symptoms.  Follow up with primary care provider (PCP) in 2 -3 days.    If you have been instructed to have an in-person evaluation today at a local Urgent Care facility, please use the link below. It will take you to a list of all of our available [facility], including address, phone number and hours of operation. Please do not delay care.  [facility]  If you or a family member do not have a primary care provider, use the link below to schedule a visit and establish care. When you choose a [facility] primary care physician or advanced practice provider, you gain a long-term partner in health. Find a Primary Care Provider  Learn more about [facility]'s in-office and virtual care options: [facility] - Get Care Now

## 2023-11-07 ENCOUNTER — Encounter: Payer: Self-pay | Admitting: Dermatology

## 2023-11-07 ENCOUNTER — Ambulatory Visit: Admitting: Dermatology

## 2023-11-07 DIAGNOSIS — D485 Neoplasm of uncertain behavior of skin: Secondary | ICD-10-CM

## 2023-11-07 DIAGNOSIS — L731 Pseudofolliculitis barbae: Secondary | ICD-10-CM | POA: Diagnosis not present

## 2023-11-07 DIAGNOSIS — L281 Prurigo nodularis: Secondary | ICD-10-CM | POA: Diagnosis not present

## 2023-11-07 MED ORDER — CLINDAMYCIN PHOSPHATE 1 % EX LOTN: TOPICAL_LOTION | Freq: Two times a day (BID) | CUTANEOUS | 11 refills | Status: AC

## 2023-11-07 NOTE — Progress Notes (Signed)
   New Patient Visit   Subjective  Charles Alvarado is a 53 y.o. male who presents for the following: spot at right face, patient thinks it could be ingrown hair, present for about 6 months. Gets irritated with shaving. Also with bumps at neck with shaving, worse with electric razor. Patient advises he does shave with the grain.    The following portions of the chart were reviewed this encounter and updated as appropriate: medications, allergies, medical history  Review of Systems:  No other skin or systemic complaints except as noted in HPI or Assessment and Plan.  Objective  Well appearing patient in no apparent distress; mood and affect are within normal limits.   A focused examination was performed of the following areas: Face, neck  Relevant exam findings are noted in the Assessment and Plan.  left cheek 8 mm hyperpigmented excoriated keratotic papule   Assessment & Plan   PFB Exam: follicular indurated inflamed hyperpigmented papules on anterior neck within bearded area and cheeks  Folliculitis occurs due to inflammation of the superficial hair follicle (pore), resulting in acne-like lesions (pus bumps). It can be infectious (bacterial, fungal) or noninfectious (shaving, tight clothing, heat/sweat, medications).  Folliculitis can be acute or chronic and recommended treatment depends on the underlying cause of folliculitis.  Treatment Plan: Apply clindamycin 1 % lotion twice daily as needed. Avoid shaving close to surface of skin   NEOPLASM OF UNCERTAIN BEHAVIOR OF SKIN left cheek Epidermal / dermal shaving  Lesion diameter (cm):  0.8 Informed consent: discussed and consent obtained   Timeout: patient name, date of birth, surgical site, and procedure verified   Procedure prep:  Patient was prepped and draped in usual sterile fashion Prep type:  Isopropyl alcohol Anesthesia: the lesion was anesthetized in a standard fashion   Anesthetic:  1% lidocaine  w/ epinephrine   1-100,000 buffered w/ 8.4% NaHCO3 Instrument used: DermaBlade   Hemostasis achieved with: pressure and aluminum chloride   Outcome: patient tolerated procedure well   Post-procedure details: wound care instructions given    Specimen 1 - Surgical pathology Differential Diagnosis: SK vs prurigo nodule vs EIC  Check Margins: No Offered topical anti-inflammatory vs shave removal. Patient opts for shave removal. Discussed risk of scarring and dyspigmentation PSEUDOFOLLICULITIS BARBAE   Related Medications clindamycin (CLEOCIN-T) 1 % lotion Apply topically 2 (two) times daily. Apply to razor bumps until they resolve  Return if symptoms worsen or fail to improve.  LILLETTE Lonell Drones, RMA, am acting as scribe for Boneta Sharps, MD .   Documentation: I have reviewed the above documentation for accuracy and completeness, and I agree with the above.  Boneta Sharps, MD

## 2023-11-07 NOTE — Patient Instructions (Addendum)

## 2023-11-08 LAB — SURGICAL PATHOLOGY

## 2023-11-11 ENCOUNTER — Ambulatory Visit: Payer: Self-pay | Admitting: Dermatology

## 2024-01-13 ENCOUNTER — Ambulatory Visit: Admitting: Family Medicine

## 2024-01-22 ENCOUNTER — Telehealth: Payer: Self-pay

## 2024-01-22 DIAGNOSIS — I679 Cerebrovascular disease, unspecified: Secondary | ICD-10-CM

## 2024-01-22 DIAGNOSIS — E782 Mixed hyperlipidemia: Secondary | ICD-10-CM

## 2024-01-22 DIAGNOSIS — I69354 Hemiplegia and hemiparesis following cerebral infarction affecting left non-dominant side: Secondary | ICD-10-CM

## 2024-01-22 MED ORDER — EZETIMIBE 10 MG PO TABS: 10.0000 mg | ORAL_TABLET | Freq: Every day | ORAL | 0 refills | Status: DC

## 2024-01-22 NOTE — Addendum Note (Signed)
 Addended by: YVONE WARREN BROCKS on: 01/22/2024 10:43 AM   Modules accepted: Orders

## 2024-01-22 NOTE — Telephone Encounter (Signed)
 MB is full. Pt needs an appt for refills

## 2024-01-22 NOTE — Telephone Encounter (Signed)
 Tried to call to notify pt sent in 30 day vm full

## 2024-01-22 NOTE — Telephone Encounter (Signed)
 Pt called to report that he is very low on his current supply. He has scheduled the next available TOC appt and wants to know if he can receive refills to last him until his scheduled time.

## 2024-01-22 NOTE — Telephone Encounter (Signed)
 Refill request on ezetimibe  (ZETIA ) 10 MG table

## 2024-01-27 ENCOUNTER — Other Ambulatory Visit: Payer: Self-pay | Admitting: Family Medicine

## 2024-01-27 DIAGNOSIS — I10 Essential (primary) hypertension: Secondary | ICD-10-CM

## 2024-01-27 NOTE — Telephone Encounter (Signed)
 Copied from CRM #[id number]. Topic: Clinical - Medication Refill >> Jan 27, 2024 10:12 AM Willma R wrote: Medication: amLODipine  (NORVASC ) 5 MG tablet  Has the patient contacted their pharmacy? Yes, call dr  This is the patient's preferred pharmacy:  [id number] - [place], [place] - [address] AT [facility] OF [address] [place] [address] [place] [address] Phone: [phone] Fax: [phone]  Is this the correct pharmacy for this prescription? Yes If no, delete pharmacy and type the correct one.   Has the prescription been filled recently? No  Is the patient out of the medication? Yes  Has the patient been seen for an appointment in the last year OR does the patient have an upcoming appointment? Yes  Can we respond through MyChart? Yes  Agent: Please be advised that Rx refills may take up to 3 business days. We ask that you follow-up with your pharmacy.

## 2024-01-28 NOTE — Telephone Encounter (Signed)
 Patient informed by pharmacy they had sent request for refill on 01/22/24. Patient is now completely out and requesting refill to be sent in as soon as possible especially with upcoming holidays.

## 2024-01-28 NOTE — Telephone Encounter (Signed)
 Pt calling in to check status of refill request. I informed of normal turn around times and also that provider is seeing pts as well but the order is pending and awaiting a signature. He asks for a phone call to confirm when this is completed so he can check with pharmacy to pick. He is concerned about being out of meds since last week and does not want to have another stroke or bp to go up.

## 2024-01-29 ENCOUNTER — Other Ambulatory Visit: Payer: Self-pay | Admitting: Family Medicine

## 2024-01-29 DIAGNOSIS — I10 Essential (primary) hypertension: Secondary | ICD-10-CM

## 2024-01-29 MED ORDER — AMLODIPINE BESYLATE 5 MG PO TABS: 5.0000 mg | ORAL_TABLET | Freq: Every day | ORAL | 1 refills | Status: DC

## 2024-01-29 NOTE — Telephone Encounter (Signed)
 Rx has been sent to pharmacy- ordered 01/29/24. Attempted to contact patient- no answer and unable to leave VM.

## 2024-02-23 ENCOUNTER — Other Ambulatory Visit: Payer: Self-pay | Admitting: Nurse Practitioner

## 2024-02-23 DIAGNOSIS — I679 Cerebrovascular disease, unspecified: Secondary | ICD-10-CM

## 2024-02-23 DIAGNOSIS — E782 Mixed hyperlipidemia: Secondary | ICD-10-CM

## 2024-02-23 DIAGNOSIS — I69354 Hemiplegia and hemiparesis following cerebral infarction affecting left non-dominant side: Secondary | ICD-10-CM

## 2024-02-24 ENCOUNTER — Other Ambulatory Visit: Payer: Self-pay | Admitting: Family Medicine

## 2024-02-24 DIAGNOSIS — I10 Essential (primary) hypertension: Secondary | ICD-10-CM

## 2024-02-24 NOTE — Telephone Encounter (Unsigned)
 Copied from CRM [id number]. Topic: Clinical - Medication Question >> Feb 24, 2024  3:20 PM Sophia H wrote: Reason for CRM: Patient is needing a fill on his amLODipine  (NORVASC ) 5 MG tablet. Has a TOC with NP Brittany Wall on 01/29 (PCP no longer at clinic). Please send enough to get him to visit with new PCP, has enough to last till this Saturday.    Also asked about ezetimibe  (ZETIA ) 10 MG tablet - advised patient this medication was sent to pharmacy today.  E-Prescribing Status: Receipt confirmed by pharmacy (02/24/2024  2:53 PM EST)   **[id number] - [place], [place] - [address] AT [facility] OF [address] [facility]

## 2024-02-24 NOTE — Telephone Encounter (Signed)
 Requested Prescriptions  Pending Prescriptions Disp Refills   ezetimibe  (ZETIA ) 10 MG tablet [Pharmacy Med Name: EZETIMIBE  10MG  TABLETS] 30 tablet 0    Sig: TAKE 1 TABLET(10 MG) BY MOUTH DAILY     Cardiovascular:  Antilipid - Sterol Transport Inhibitors Failed - 02/24/2024  2:52 PM      Failed - Lipid Panel in normal range within the last 12 months    Cholesterol  Date Value Ref Range Status  01/22/2023 120 <200 mg/dL Final   LDL Cholesterol (Calc)  Date Value Ref Range Status  01/22/2023 69 mg/dL (calc) Final    Comment:    Reference range: <100 . Desirable range <100 mg/dL for primary prevention;   <70 mg/dL for patients with CHD or diabetic patients  with > or = 2 CHD risk factors. SABRA LDL-C is now calculated using the Martin-Hopkins  calculation, which is a validated novel method providing  better accuracy than the Friedewald equation in the  estimation of LDL-C.  Gladis APPLETHWAITE et al. SANDREA. 7986;689(80): 2061-2068  (http://education.Quest[employer].com/faq/FAQ164)    HDL  Date Value Ref Range Status  01/22/2023 36 (L) > OR = 40 mg/dL Final   Triglycerides  Date Value Ref Range Status  01/22/2023 72 <150 mg/dL Final         Passed - AST in normal range and within 360 days    AST  Date Value Ref Range Status  07/23/2023 25 10 - 35 U/L Final         Passed - ALT in normal range and within 360 days    ALT  Date Value Ref Range Status  07/23/2023 30 9 - 46 U/L Final         Passed - Patient is not pregnant      Passed - Valid encounter within last 12 months    Recent Outpatient Visits           7 months ago Essential hypertension   [facility] Leavy Mole, PA-C

## 2024-02-25 MED ORDER — AMLODIPINE BESYLATE 5 MG PO TABS: 5.0000 mg | ORAL_TABLET | Freq: Every day | ORAL | 0 refills | Status: DC

## 2024-02-25 NOTE — Telephone Encounter (Signed)
 Requested Prescriptions  Pending Prescriptions Disp Refills   amLODipine  (NORVASC ) 5 MG tablet 30 tablet 0    Sig: Take 1 tablet (5 mg total) by mouth daily.     Cardiovascular: Calcium  Channel Blockers 2 Failed - 02/25/2024  1:05 PM      Failed - Valid encounter within last 6 months    Recent Outpatient Visits           7 months ago Essential hypertension   [facility] Leavy Mole, PA-C              Passed - Last BP in normal range    BP Readings from Last 1 Encounters:  07/23/23 130/82         Passed - Last Heart Rate in normal range    Pulse Readings from Last 1 Encounters:  07/23/23 84

## 2024-03-05 ENCOUNTER — Encounter: Payer: Self-pay | Admitting: Sleep Medicine

## 2024-03-05 ENCOUNTER — Encounter: Payer: Self-pay | Admitting: Family Medicine

## 2024-03-05 ENCOUNTER — Ambulatory Visit (INDEPENDENT_AMBULATORY_CARE_PROVIDER_SITE_OTHER): Admitting: Family Medicine

## 2024-03-05 VITALS — BP 124/76 | HR 65 | Resp 16 | Ht 74.0 in | Wt 230.0 lb

## 2024-03-05 DIAGNOSIS — E559 Vitamin D deficiency, unspecified: Secondary | ICD-10-CM | POA: Diagnosis not present

## 2024-03-05 DIAGNOSIS — M25511 Pain in right shoulder: Secondary | ICD-10-CM | POA: Diagnosis not present

## 2024-03-05 DIAGNOSIS — G4700 Insomnia, unspecified: Secondary | ICD-10-CM | POA: Diagnosis not present

## 2024-03-05 DIAGNOSIS — Z8673 Personal history of transient ischemic attack (TIA), and cerebral infarction without residual deficits: Secondary | ICD-10-CM | POA: Diagnosis not present

## 2024-03-05 DIAGNOSIS — I10 Essential (primary) hypertension: Secondary | ICD-10-CM

## 2024-03-05 DIAGNOSIS — E782 Mixed hyperlipidemia: Secondary | ICD-10-CM | POA: Diagnosis not present

## 2024-03-05 DIAGNOSIS — G8929 Other chronic pain: Secondary | ICD-10-CM

## 2024-03-05 DIAGNOSIS — R7303 Prediabetes: Secondary | ICD-10-CM

## 2024-03-05 MED ORDER — ATORVASTATIN CALCIUM 40 MG PO TABS: 40.0000 mg | ORAL_TABLET | Freq: Every day | ORAL | 0 refills | Status: AC

## 2024-03-05 MED ORDER — EZETIMIBE 10 MG PO TABS: 10.0000 mg | ORAL_TABLET | Freq: Every day | ORAL | 0 refills | Status: AC

## 2024-03-05 MED ORDER — CELECOXIB 100 MG PO CAPS: 100.0000 mg | ORAL_CAPSULE | Freq: Two times a day (BID) | ORAL | 0 refills | Status: AC | PRN

## 2024-03-05 MED ORDER — AMLODIPINE BESYLATE 5 MG PO TABS: 5.0000 mg | ORAL_TABLET | Freq: Every day | ORAL | 0 refills | Status: AC

## 2024-03-05 MED ORDER — LISINOPRIL 40 MG PO TABS: 40.0000 mg | ORAL_TABLET | Freq: Every day | ORAL | 0 refills | Status: AC

## 2024-03-05 NOTE — Assessment & Plan Note (Addendum)
 Mixed hyperlipidemia managed with statin therapy and Zetia . Mixed hyperlipidemia controlled, last LDL 69 in 01/2023.   -Update labs  -Continue with lifestyle modifications including refraining from tobacco use/smoking, dietary changes, and increased exercise/activity as tolerated. Written education provided with AVS -Continue Atorvastatin  40mg  daily, refill provided  -Continue Zetia  10mg  daily, refill provided  Orders:   atorvastatin  (LIPITOR) 40 MG tablet; Take 1 tablet (40 mg total) by mouth at bedtime.   ezetimibe  (ZETIA ) 10 MG tablet; Take 1 tablet (10 mg total) by mouth daily.   CBC w/Diff/Platelet   Comprehensive Metabolic Panel (CMET)   Lipid Profile

## 2024-03-05 NOTE — Assessment & Plan Note (Addendum)
 Hx of ischemic stroke. Followed by neurology, last visit in 09/2022. Vasculitis workup negative per neurology. Neuro recommended continue secondary stroke prevention including statin therapy for lipid control, Aspirin 81mg  daily and management of HTN.  Also recommendations included OSA workup however patient admits he did not follow through. Will refer back to pulmonology for OSA work up.   -Continue Aspirin 81mg  daily -Maintain adequate control of HTN and hyperlipidemia as addressed above  Orders:   atorvastatin  (LIPITOR) 40 MG tablet; Take 1 tablet (40 mg total) by mouth at bedtime.   lisinopril  (ZESTRIL ) 40 MG tablet; Take 1 tablet (40 mg total) by mouth daily.   amLODipine  (NORVASC ) 5 MG tablet; Take 1 tablet (5 mg total) by mouth daily.   ezetimibe  (ZETIA ) 10 MG tablet; Take 1 tablet (10 mg total) by mouth daily.   CBC w/Diff/Platelet   Comprehensive Metabolic Panel (CMET)   HgB A1c   Lipid Profile

## 2024-03-05 NOTE — Assessment & Plan Note (Addendum)
 Prediabetes managed with lifestyle interventions. Last A1c 6.2 in 07/2023  -Continue with lifestyle modifications. Written education provided with AVS.  -Update labs  Orders:   CBC w/Diff/Platelet   Comprehensive Metabolic Panel (CMET)   HgB A1c

## 2024-03-05 NOTE — Assessment & Plan Note (Addendum)
 HTN controlled ; BP is 124/76 at today's visit    -Update labs -Continue with lifestyle modifications  -Continue Amlodipine  5mg  daily, refill provided -Continue Lisinopril  40ng daily, refill provided  Orders:   lisinopril  (ZESTRIL ) 40 MG tablet; Take 1 tablet (40 mg total) by mouth daily.   amLODipine  (NORVASC ) 5 MG tablet; Take 1 tablet (5 mg total) by mouth daily.   CBC w/Diff/Platelet   Comprehensive Metabolic Panel (CMET)

## 2024-03-05 NOTE — Progress Notes (Signed)
 "  Established Patient Office Visit  Subjective   Patient ID: Charles Alvarado, male    DOB: [birth year]  Age: 54 y.o. MRN: [id number]  Chief Complaint  Patient presents with   Transitions Of Care    HPI Charles Alvarado is a 54 year old male seen today for transition of care and medication refills. He is a new patient to me, though established in office. He voices concerns today which are addressed below.   He has concerns that in the last 3 months he has had pain when wiping. He has used OTC preparation H and does not see improvement in symptoms. He voices discomfort is intermittent stating sometimes he can go one week without symptoms. He has seen blood on the tissue paper when wiping but denies blood in the stool or in the toilet. He does not feel that he needs to strain with a bowel movement. He describes stool to be a 3 or 4 on the [place] stool chart. He does endorse associated rectal itching. Denies constipation or diarrhea. He reports he has bowel movements daily, typically 2 to 3 times daily. Colonoscopy 03/2022 did show internal hemorrhoids, recommending 7 year follow up. Recommended physical exam today to assess for external hemorrhoids which he declines. Recommended hydrocortisone suppositories based off colonoscopy findings and symptoms as described, which he also declines. He voices he will try OTC Hydrocortisone rectal topical cream first. Return precautions discussed, he voices understanding.   He complains of right shoulder pain. He reports hx of right shoulder surgeries x 3. He voices he had taken Oxycodone  in the past for his shoulder pain. He voices he was on this for several years but eventually weaned himself off of the medication because he no longer wanted to take the medication. He is struggling with right shoulder pain . He has taken Tylenol  and this does not provide relief. He voices he has used topical analgesics without relief. He voices pain is aggravated by colder  weather. He voices pain is not daily, typically only flares 2 to 3 times weekly. He voices he has taken Ibuprofen  and this does seem to help but only in higher doses. Discussed starting Celebrex  100mg  BID PRN. He is receptive.  Patient with hx of ischemic stroke of right frontal and parietal territories. 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 Initially concern for vasculitis however last neurology note from 09/26/22 reviews where vasculitis workup was completed and negative. Neurology recommendations and plan indicated to continue with secondary stroke prevention including Atorvastatin  40mg  daily for lipid control with goal LDL of 70 and Aspirin 81mg  daily. Patient confirms. One recommendation included sleep study, however he admits he did not follow through. He does struggle with insomnia and reports he does feel like he needs a CPAP. He also endorses complaints of associated fatigue. Denies orthopnea or shortness of breath.  Denies dizziness or frequent headaches. He does endorse intermittent tingling but this is not frequent. Denies other associated symptoms.    He reports he is not fasting today but is agreeable to return for labs within the next one month.    Review of Systems  Constitutional:  Positive for malaise/fatigue. Negative for weight loss.  Respiratory:  Negative for shortness of breath.   Cardiovascular:  Negative for chest pain, palpitations and orthopnea.  Gastrointestinal:  Negative for abdominal pain, blood in stool,  constipation, diarrhea, nausea and vomiting.  Musculoskeletal:  Positive for joint pain.  Neurological:  Positive for tingling. Negative for dizziness and headaches.  Psychiatric/Behavioral:  The patient has insomnia.       Objective:     BP 124/76   Pulse 65   Resp 16   Ht 6' 2 (1.88 m)    Wt 230 lb (104.3 kg)   SpO2 97%   BMI 29.53 kg/m  BP Readings from Last 3 Encounters:  03/05/24 124/76  07/23/23 130/82  01/22/23 132/72   Wt Readings from Last 3 Encounters:  03/05/24 230 lb (104.3 kg)  07/23/23 229 lb (103.9 kg)  01/22/23 223 lb (101.2 kg)   SpO2 Readings from Last 3 Encounters:  03/05/24 97%  07/23/23 96%  01/22/23 98%      Physical Exam Constitutional:      Appearance: Normal appearance. He is well-developed.  Cardiovascular:     Rate and Rhythm: Normal rate and regular rhythm.     Heart sounds: Normal heart sounds.  Pulmonary:     Effort: Pulmonary effort is normal. No respiratory distress.     Breath sounds: Normal breath sounds.  Skin:    General: Skin is warm and dry.  Neurological:     General: No focal deficit present.     Mental Status: He is oriented to person, place, and time.  Psychiatric:        Mood and Affect: Mood normal.        Behavior: Behavior normal.     Last CBC Lab Results  Component Value Date   WBC 4.8 07/23/2023   HGB 12.7 (L) 07/23/2023   HCT 39.0 07/23/2023   MCV 84.4 07/23/2023   MCH 27.5 07/23/2023   RDW 13.0 07/23/2023   PLT 227 07/23/2023   Last metabolic panel Lab Results  Component Value Date   GLUCOSE 104 (H) 07/23/2023   NA 138 07/23/2023   K 4.2 07/23/2023   CL 105 07/23/2023   CO2 27 07/23/2023   BUN 15 07/23/2023   CREATININE 1.04 07/23/2023   EGFR 86 07/23/2023   CALCIUM  9.6 07/23/2023   PROT 6.9 07/23/2023   BILITOT 0.4 07/23/2023   AST 25 07/23/2023   ALT 30 07/23/2023   ANIONGAP 4 (L) 06/15/2015   Last lipids Lab Results  Component Value Date   CHOL 120 01/22/2023   HDL 36 (L) 01/22/2023   LDLCALC 69 01/22/2023   TRIG 72 01/22/2023   CHOLHDL 3.3 01/22/2023   Last hemoglobin A1c Lab Results  Component Value Date   HGBA1C 6.2 (H) 07/23/2023   Last thyroid functions Lab Results  Component Value Date   TSH 1.88 07/23/2023   FREET4 1.1 07/23/2023   Last vitamin D  Lab  Results  Component Value Date   VD25OH 30 07/23/2023   Last vitamin B12 and Folate Lab Results  Component Value Date   VITAMINB12 477 07/23/2023          Assessment & Plan:   Assessment & Plan Mixed hyperlipidemia Mixed hyperlipidemia managed with statin therapy and Zetia . Mixed hyperlipidemia controlled, last LDL 69 in 01/2023.   -Update labs  -Continue with lifestyle modifications including refraining from tobacco use/smoking, dietary changes, and increased exercise/activity as tolerated. Written education provided with AVS -Continue Atorvastatin  40mg  daily, refill provided  -Continue Zetia  10mg  daily, refill provided  Orders:   atorvastatin  (LIPITOR) 40 MG tablet; Take 1 tablet (40 mg total) by mouth at bedtime.   ezetimibe  (ZETIA ) 10 MG tablet; Take  1 tablet (10 mg total) by mouth daily.   CBC w/Diff/Platelet   Comprehensive Metabolic Panel (CMET)   Lipid Profile  Essential hypertension HTN controlled ; BP is 124/76 at today's visit    -Update labs -Continue with lifestyle modifications  -Continue Amlodipine  5mg  daily, refill provided -Continue Lisinopril  40ng daily, refill provided  Orders:   lisinopril  (ZESTRIL ) 40 MG tablet; Take 1 tablet (40 mg total) by mouth daily.   amLODipine  (NORVASC ) 5 MG tablet; Take 1 tablet (5 mg total) by mouth daily.   CBC w/Diff/Platelet   Comprehensive Metabolic Panel (CMET)  History of ischemic stroke Hx of ischemic stroke. Followed by neurology, last visit in 09/2022. Vasculitis workup negative per neurology. Neuro recommended continue secondary stroke prevention including statin therapy for lipid control, Aspirin 81mg  daily and management of HTN.  Also recommendations included OSA workup however patient admits he did not follow through. Will refer back to pulmonology for OSA work up.   -Continue Aspirin 81mg  daily -Maintain adequate control of HTN and hyperlipidemia as addressed above  Orders:   atorvastatin  (LIPITOR) 40 MG  tablet; Take 1 tablet (40 mg total) by mouth at bedtime.   lisinopril  (ZESTRIL ) 40 MG tablet; Take 1 tablet (40 mg total) by mouth daily.   amLODipine  (NORVASC ) 5 MG tablet; Take 1 tablet (5 mg total) by mouth daily.   ezetimibe  (ZETIA ) 10 MG tablet; Take 1 tablet (10 mg total) by mouth daily.   CBC w/Diff/Platelet   Comprehensive Metabolic Panel (CMET)   HgB A1c   Lipid Profile  Prediabetes Prediabetes managed with lifestyle interventions. Last A1c 6.2 in 07/2023  -Continue with lifestyle modifications. Written education provided with AVS.  -Update labs  Orders:   CBC w/Diff/Platelet   Comprehensive Metabolic Panel (CMET)   HgB A1c  Chronic right shoulder pain Complaints of right shoulder pain. See HPI for details.  -Start Celebrex  100mg  BID PRN -Consider returning to ortho for follow up -Consider pain management referral Orders:   celecoxib  (CELEBREX ) 100 MG capsule; Take 1 capsule (100 mg total) by mouth 2 (two) times daily as needed.   Vitamin D  (25 hydroxy)  Insomnia, unspecified type Complaints of insomnia with associated fatigue. See HPI for details.  -Referral to pulmonology/sleep medicine for insomnia/OSA work up. Patient is agreeable.  Orders:   Ambulatory referral to Pulmonology   TSH  Vitamin D  deficiency Patient reports past vitamin D  deficiency.   -Update labs  Orders:   Vitamin D  (25 hydroxy)      Return in about 4 months (around 07/03/2024) for annual physical with fasting labs .    Charles LOISE CORE, FNP "

## 2024-03-12 ENCOUNTER — Encounter: Payer: Self-pay | Admitting: Sleep Medicine

## 2024-07-02 ENCOUNTER — Encounter: Admitting: Family Medicine
